# Patient Record
Sex: Female | Born: 1950 | Race: White | Hispanic: No | Smoking: Former smoker
Health system: Southern US, Academic
[De-identification: ages and names within clinical notes are randomized; demographics above are authoritative.]

## PROBLEM LIST (undated history)

## (undated) DIAGNOSIS — Z780 Asymptomatic menopausal state: Secondary | ICD-10-CM

## (undated) DIAGNOSIS — G4730 Sleep apnea, unspecified: Secondary | ICD-10-CM

## (undated) DIAGNOSIS — R790 Abnormal level of blood mineral: Secondary | ICD-10-CM

## (undated) DIAGNOSIS — G2581 Restless legs syndrome: Secondary | ICD-10-CM

## (undated) DIAGNOSIS — M51369 Other intervertebral disc degeneration, lumbar region without mention of lumbar back pain or lower extremity pain: Secondary | ICD-10-CM

## (undated) DIAGNOSIS — R32 Unspecified urinary incontinence: Secondary | ICD-10-CM

## (undated) DIAGNOSIS — R918 Other nonspecific abnormal finding of lung field: Secondary | ICD-10-CM

## (undated) DIAGNOSIS — T7840XA Allergy, unspecified, initial encounter: Secondary | ICD-10-CM

## (undated) DIAGNOSIS — F419 Anxiety disorder, unspecified: Secondary | ICD-10-CM

## (undated) DIAGNOSIS — E538 Deficiency of other specified B group vitamins: Secondary | ICD-10-CM

## (undated) DIAGNOSIS — F32A Depression, unspecified: Secondary | ICD-10-CM

## (undated) DIAGNOSIS — N281 Cyst of kidney, acquired: Secondary | ICD-10-CM

## (undated) DIAGNOSIS — I8390 Asymptomatic varicose veins of unspecified lower extremity: Secondary | ICD-10-CM

## (undated) DIAGNOSIS — Z9889 Other specified postprocedural states: Secondary | ICD-10-CM

## (undated) DIAGNOSIS — K449 Diaphragmatic hernia without obstruction or gangrene: Secondary | ICD-10-CM

## (undated) DIAGNOSIS — K219 Gastro-esophageal reflux disease without esophagitis: Secondary | ICD-10-CM

## (undated) DIAGNOSIS — E559 Vitamin D deficiency, unspecified: Secondary | ICD-10-CM

## (undated) DIAGNOSIS — F0390 Unspecified dementia without behavioral disturbance: Secondary | ICD-10-CM

## (undated) DIAGNOSIS — Z860100 Personal history of colon polyps, unspecified: Secondary | ICD-10-CM

## (undated) DIAGNOSIS — I471 Supraventricular tachycardia, unspecified: Secondary | ICD-10-CM

## (undated) DIAGNOSIS — E213 Hyperparathyroidism, unspecified: Secondary | ICD-10-CM

## (undated) DIAGNOSIS — H409 Unspecified glaucoma: Secondary | ICD-10-CM

## (undated) DIAGNOSIS — R0989 Other specified symptoms and signs involving the circulatory and respiratory systems: Secondary | ICD-10-CM

## (undated) DIAGNOSIS — I10 Essential (primary) hypertension: Secondary | ICD-10-CM

## (undated) DIAGNOSIS — E785 Hyperlipidemia, unspecified: Secondary | ICD-10-CM

## (undated) HISTORY — DX: Diaphragmatic hernia without obstruction or gangrene: K44.9

## (undated) HISTORY — DX: Restless legs syndrome: G25.81

## (undated) HISTORY — PX: ORTHOPEDIC SURGERY: SHX850

## (undated) HISTORY — DX: Asymptomatic varicose veins of unspecified lower extremity: I83.90

## (undated) HISTORY — DX: Other specified symptoms and signs involving the circulatory and respiratory systems: R09.89

## (undated) HISTORY — PX: COLONOSCOPY: SHX174

## (undated) HISTORY — DX: Anxiety disorder, unspecified: F41.9

## (undated) HISTORY — DX: Unspecified urinary incontinence: R32

## (undated) HISTORY — DX: Depression, unspecified: F32.A

## (undated) HISTORY — DX: Supraventricular tachycardia, unspecified: I47.10

## (undated) HISTORY — DX: Deficiency of other specified B group vitamins: E53.8

## (undated) HISTORY — DX: Vitamin D deficiency, unspecified: E55.9

## (undated) HISTORY — PX: HX CARDIAC ABLATION: 2100001323

## (undated) HISTORY — DX: Allergy, unspecified, initial encounter: T78.40XA

## (undated) HISTORY — DX: Hyperparathyroidism, unspecified: E21.3

## (undated) HISTORY — DX: Other nonspecific abnormal finding of lung field: R91.8

## (undated) HISTORY — DX: Unspecified glaucoma: H40.9

## (undated) HISTORY — DX: Supraventricular tachycardia (CMS HCC): I47.1

## (undated) HISTORY — PX: ESOPHAGOGASTRODUODENOSCOPY: SHX1529

## (undated) HISTORY — DX: Gastro-esophageal reflux disease without esophagitis: K21.9

## (undated) HISTORY — DX: Hyperlipidemia, unspecified: E78.5

## (undated) HISTORY — DX: Hypercalcemia: E83.52

## (undated) HISTORY — DX: Unspecified dementia, unspecified severity, without behavioral disturbance, psychotic disturbance, mood disturbance, and anxiety: F03.90

## (undated) HISTORY — PX: PARATHYROID GLAND SURGERY: SHX732

## (undated) HISTORY — DX: Sleep apnea, unspecified: G47.30

## (undated) HISTORY — DX: Other intervertebral disc degeneration, lumbar region without mention of lumbar back pain or lower extremity pain: M51.369

## (undated) HISTORY — DX: Cyst of kidney, acquired: N28.1

## (undated) HISTORY — DX: Asymptomatic menopausal state: Z78.0

## (undated) HISTORY — DX: Abnormal level of blood mineral: R79.0

## (undated) HISTORY — DX: Other specified postprocedural states: Z98.890

## (undated) HISTORY — DX: Essential (primary) hypertension: I10

## (undated) HISTORY — PX: HX COLON SURGERY (ANY): 2100001403

## (undated) HISTORY — DX: Personal history of colon polyps, unspecified: Z86.0100

## (undated) HISTORY — PX: RADIOFREQUENCY ABLATION: SHX2290

---

## 1990-05-19 ENCOUNTER — Inpatient Hospital Stay (HOSPITAL_COMMUNITY): Payer: Self-pay | Admitting: Orthopaedic Surgery

## 1996-04-28 HISTORY — PX: HX TAH AND BSO: SHX83

## 2008-04-28 HISTORY — PX: HX CARPAL TUNNEL RELEASE: SHX101

## 2011-04-29 HISTORY — PX: HX CARDIAC ABLATION: 2100001323

## 2011-07-07 DIAGNOSIS — R002 Palpitations: Secondary | ICD-10-CM | POA: Insufficient documentation

## 2011-07-18 DIAGNOSIS — I499 Cardiac arrhythmia, unspecified: Secondary | ICD-10-CM | POA: Insufficient documentation

## 2011-09-01 DIAGNOSIS — I4719 Other supraventricular tachycardia: Secondary | ICD-10-CM | POA: Insufficient documentation

## 2012-09-07 DIAGNOSIS — M51369 Other intervertebral disc degeneration, lumbar region without mention of lumbar back pain or lower extremity pain: Secondary | ICD-10-CM | POA: Insufficient documentation

## 2012-09-07 DIAGNOSIS — M47812 Spondylosis without myelopathy or radiculopathy, cervical region: Secondary | ICD-10-CM | POA: Insufficient documentation

## 2013-04-28 DIAGNOSIS — Z85038 Personal history of other malignant neoplasm of large intestine: Secondary | ICD-10-CM

## 2013-04-28 HISTORY — DX: Personal history of other malignant neoplasm of large intestine: Z85.038

## 2017-08-20 DIAGNOSIS — G2581 Restless legs syndrome: Secondary | ICD-10-CM | POA: Insufficient documentation

## 2017-09-22 IMAGING — MG 3D SCREENING MAMMO BIL W/CAD
5 series · 7 of 24 positions shown · non-contrast
Comparison: 02/18/2018

------------- REPORT [id number] -------------
ALEKSANDER AGURCIA,DO

EXAM:  3D BILATERAL ANNUAL SCREENING DIGITAL MAMMOGRAM WITH TOMOSYNTHESIS AND CAD
INDICATION: Screening.

[Series 6101: R CC · right · 0.10mm/px · 2 of 4 slices shown]
[im 1/4]
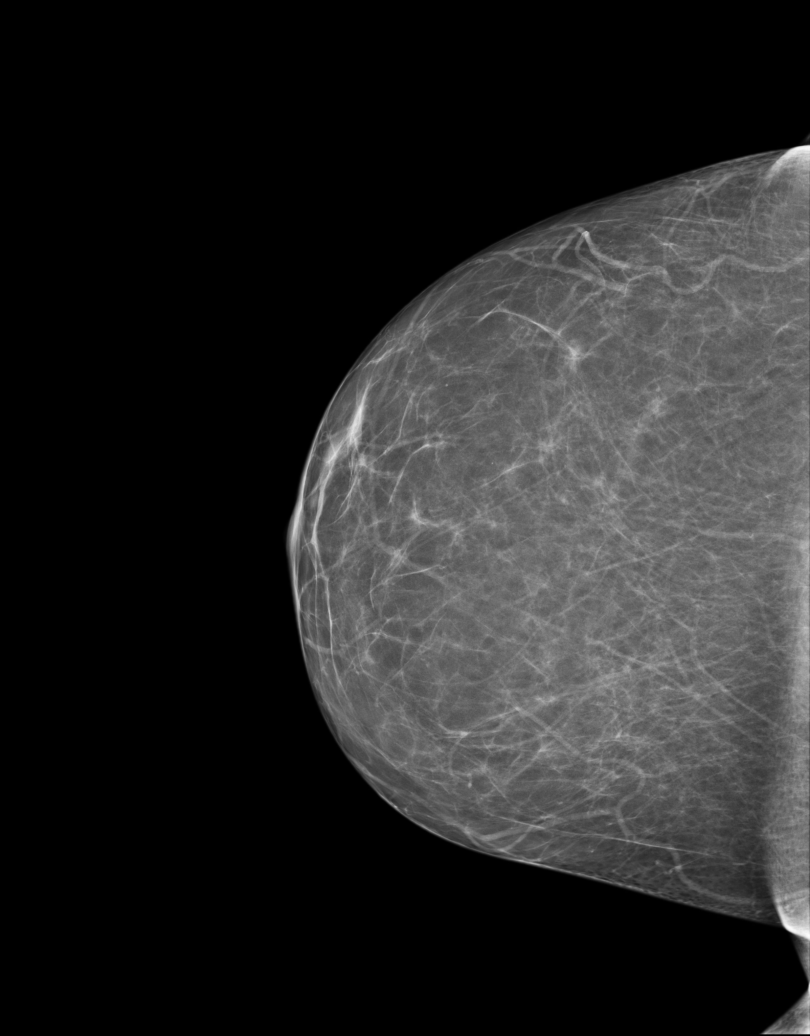
[im 4/4]
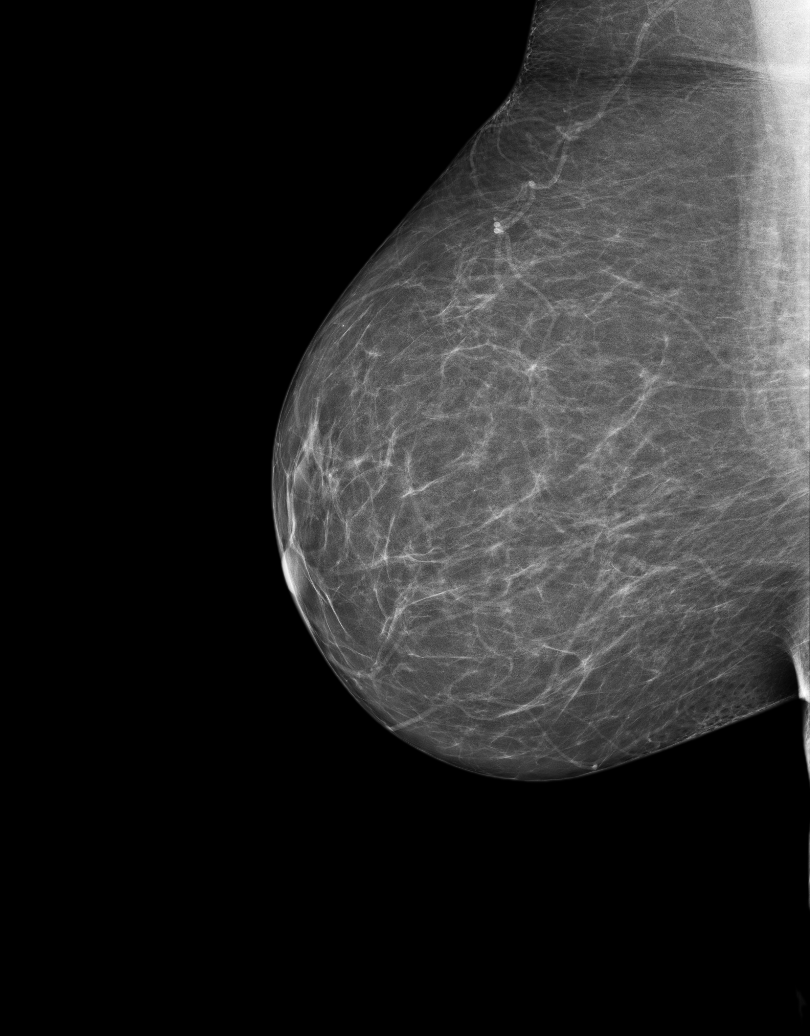

[Series 6103: 3D SCREENING MAMMO BIL W/CAD · 2 acquisitions, 2 frames shown (1 of 2)]
[im 1/2]
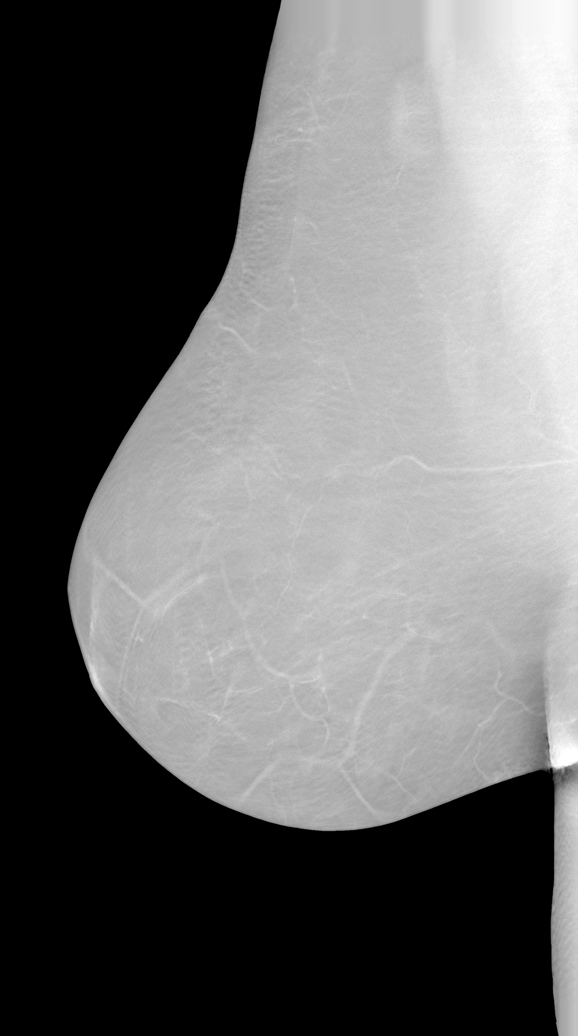
[im 2/2]
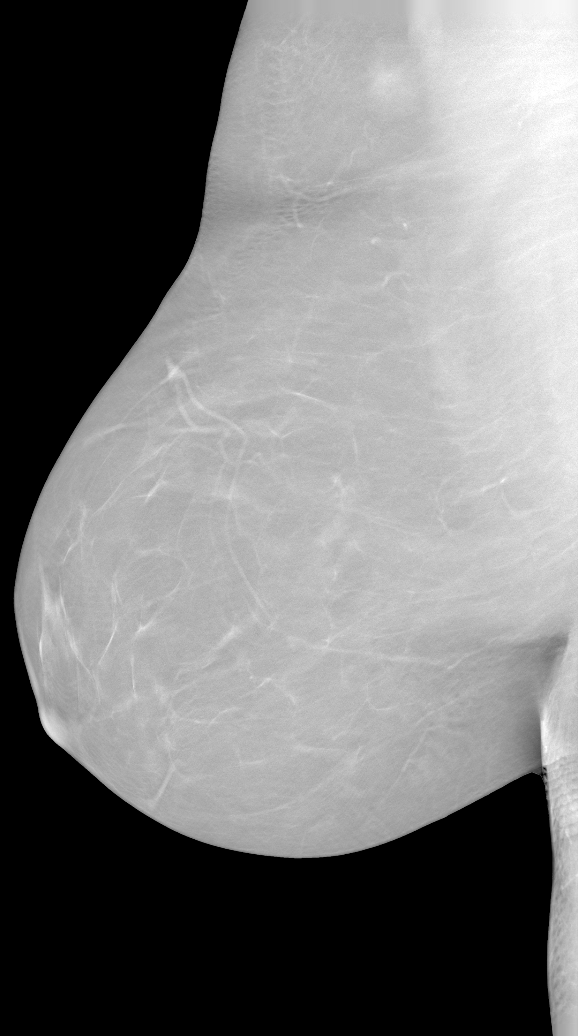

[3D SCREENING MAMMO BIL W/CAD (2 of 2) · tomo slice 14/87.0]
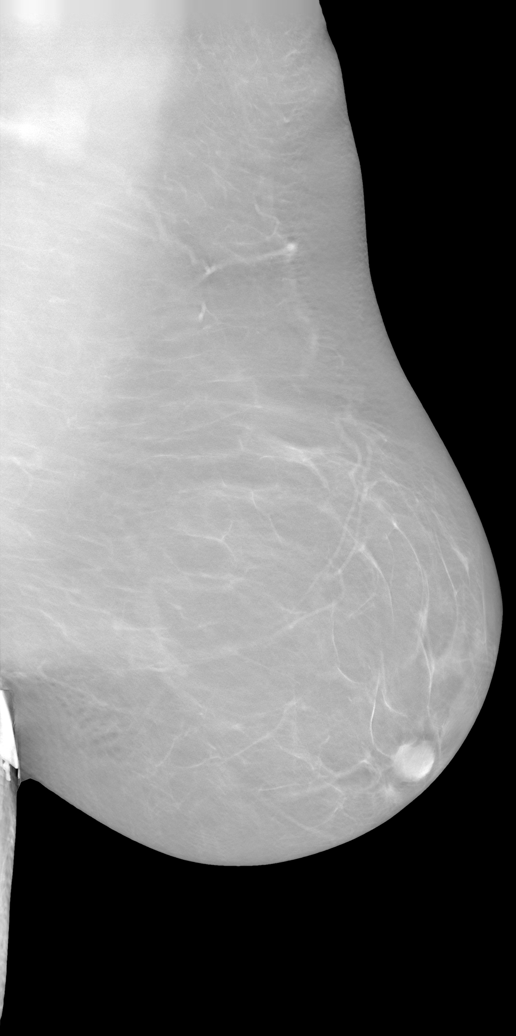

[R]
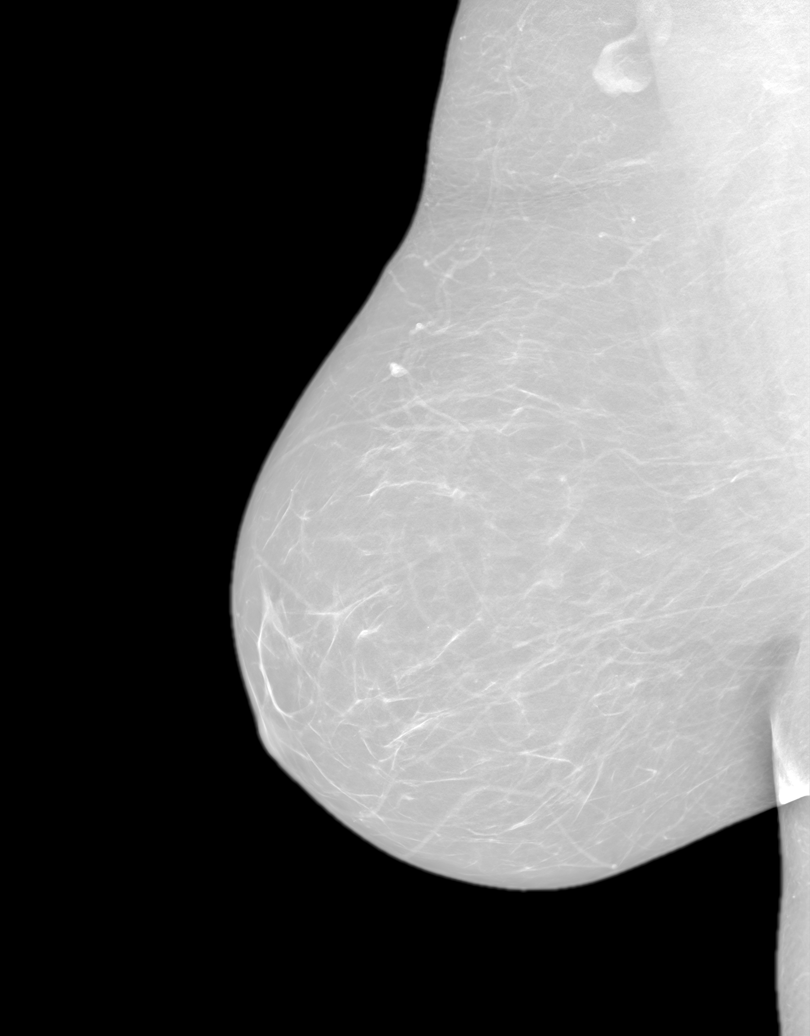

[L]
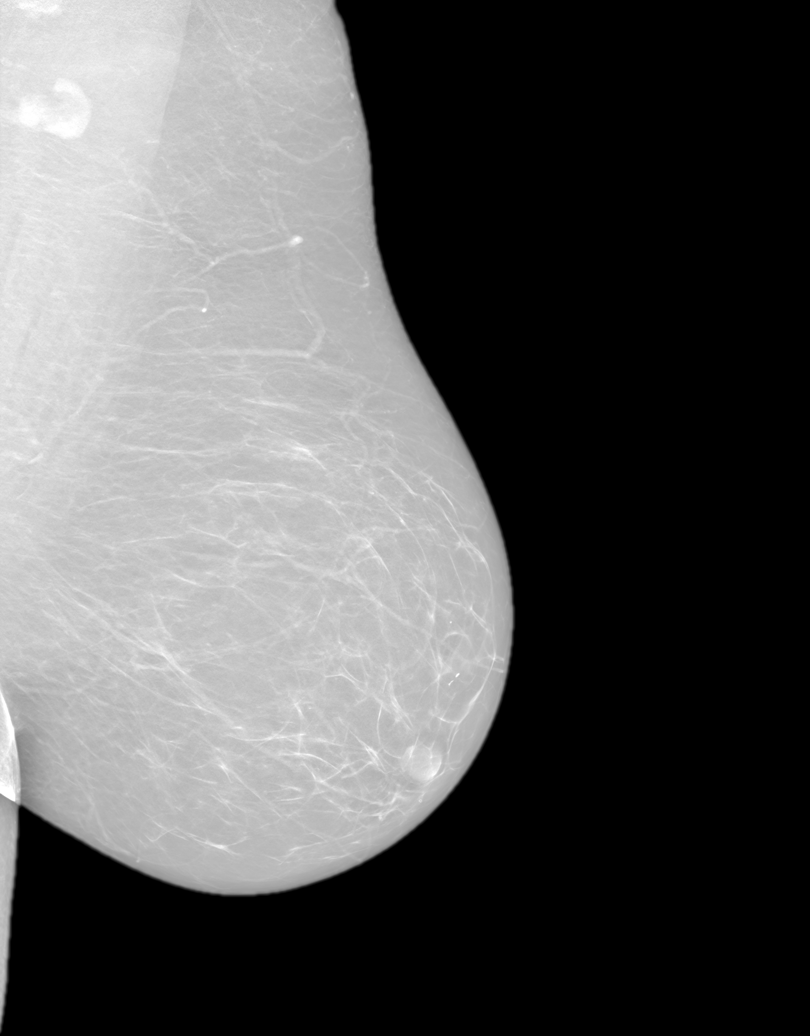

[7 of 24 positions shown; findings below may reference images not displayed]

FINDINGS: A screening mammogram is performed.  The examination shows involutional changes consisting of fatty replacement of the glandular tissue bilaterally.  When compared with an earlier study dated 02/18/2018, no interval development of a focal mass or malignant calcifications is seen.  3D tomosynthesis shows no evidence for an occult mass or calcifications.
IMPRESSION: 1.  Normal stable screening mammogram. 

2.  ACR BREAST DENSITY:  A (Almost entirely fatty). 

3.  BIRADS 1-Negative mammogram. Patient has been added in a reminder system with a target date for the next screening mammography.

4.  Management recommendation:  Routine mammographic screening.   

Final Assessment Code:

Bi-Rads 1 

BI-RADS 0
Need additional imaging evaluation

BI-RADS 1
Negative mammogram

BI-RADS 2
Benign finding

BI-RADS 3
Probably benign finding: short-interval follow-up suggested

BI-RADS 4
Suspicious abnormality:  biopsy should be considered

BI-RADS 5
Highly suggestive of malignancy; appropriate action should be taken

BI-RADS 6
Known Biopsy-proven Malignancy – Appropriate action should be taken

NOTE:
In compliance with Federal regulations, the results of this mammogram are being sent to the patient.

------------- REPORT [id number] -------------
[facility]
[address]
Daina Ms.HANZHUK, GHOTHA:
We wish to report the following on your recent mammography examination. We are sending a report to your referring physician or other health care provider. 
(       Normal/Negative:
No evidence of cancer.
This statement is mandated by the [place], Department of Health.
Your examination was performed by one of our technologists, who are registered radiological technologists and also specially certified in mammography:
___
Parlak, Edaly (M)
___
Dang, Mcalex (M)

Your mammogram was interpreted by our radiologist.

( 
Sofeine Made, M.D.

(Annual Breast Examination by a physician or other health care provider
(Annual Mammography Screening beginning at age 40
(Monthly Breast Self Examination

## 2018-12-23 DIAGNOSIS — G4733 Obstructive sleep apnea (adult) (pediatric): Secondary | ICD-10-CM | POA: Insufficient documentation

## 2019-01-27 IMAGING — MG 3D SCREENING MAMMO BIL W/CAD
5 series · 7 of 24 positions shown · non-contrast
Comparison: 09/30/2019 and 09/27/2018.

------------- REPORT [id number] -------------
[facility]
[address]
Daina Ms.HANZHUK, GHOTHA:
We wish to report the following on your recent mammography examination. We are sending a report to your referring physician or other health care provider. 
(       Normal/Negative:
No evidence of cancer.
This statement is mandated by the [place], Department of Health.
Your examination was performed by one of our technologists, who are registered radiological technologists and also specially certified in mammography:
___
Parlak, Edaly (M)
Nepomuceno, Martinez (M)

Your mammogram was interpreted by our radiologist.
( 
Sofeine Made, M.D.
(Annual Breast Examination by a physician or other health care provider
(Annual Mammography Screening beginning at age 40
(Monthly Breast Self Examination
------------- REPORT [id number] -------------
ANUKELIS PITRENAS,DO
EXAM:  3D BILATERAL ANNUAL SCREENING DIGITAL MAMMOGRAM WITH TOMOSYNTHESIS AND CAD
INDICATION: Screening.

[R CC · right · 0.10mm/px · 2 of 2 slices shown]
[im 1/2]
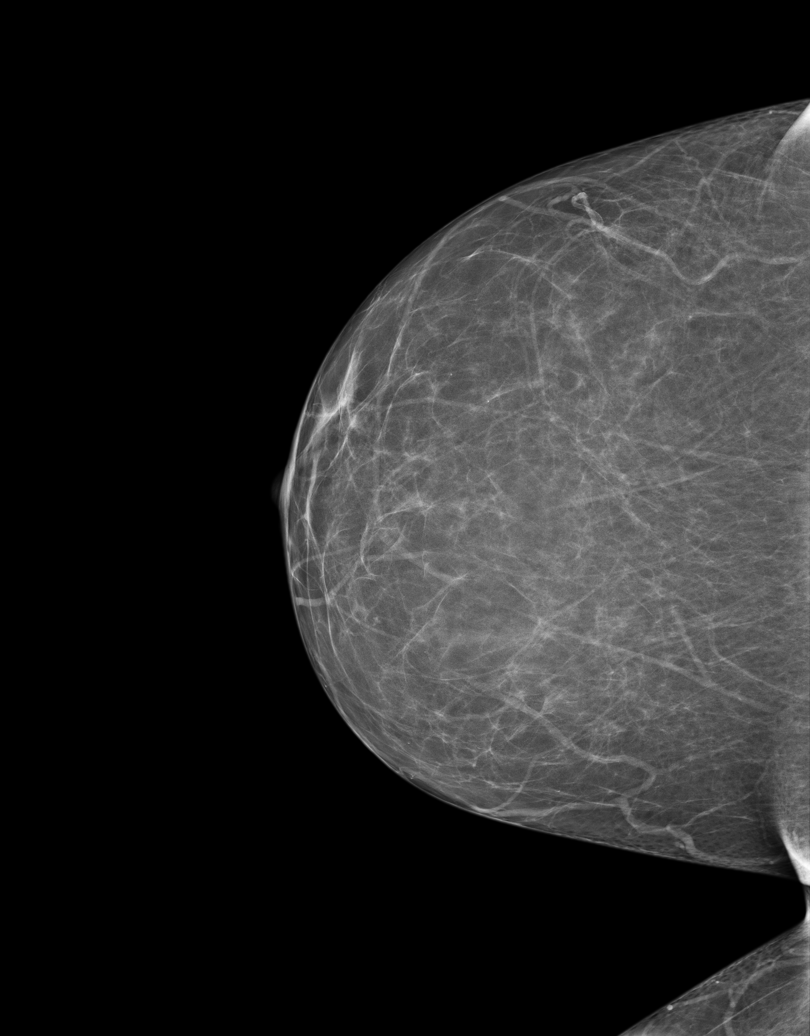
[im 2/2]
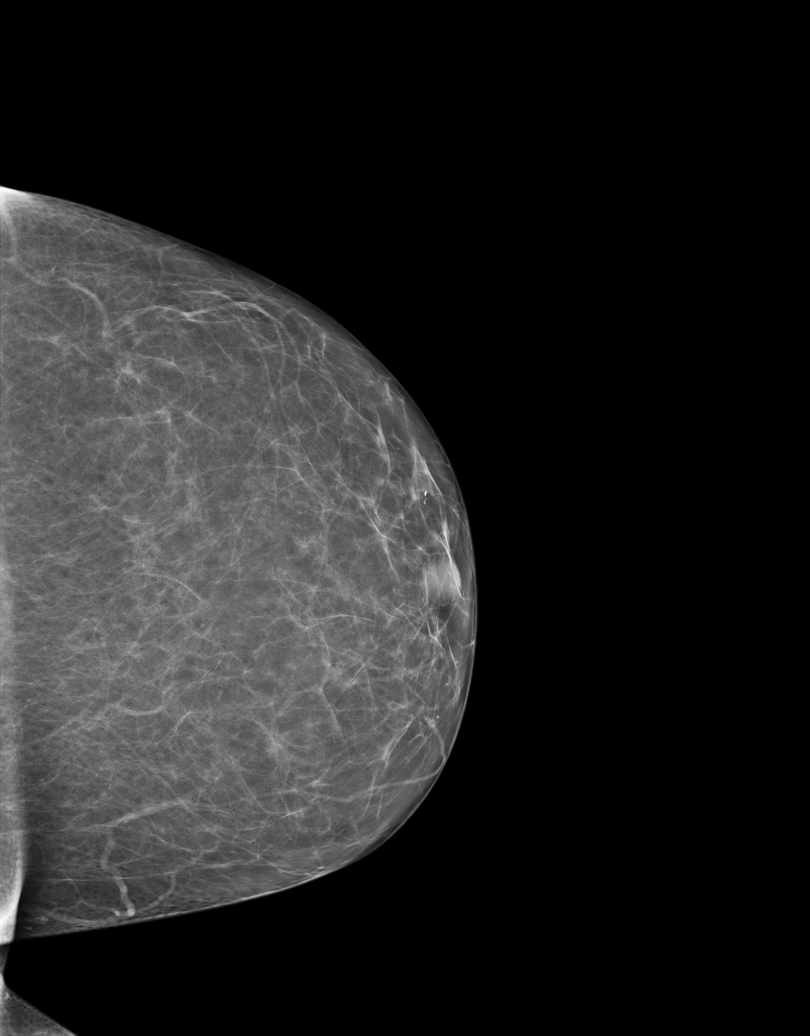

[3D SCREENING MAMMO BIL W/CAD · 2 acquisitions, 2 frames shown (1 of 2)]
[im 1/2]
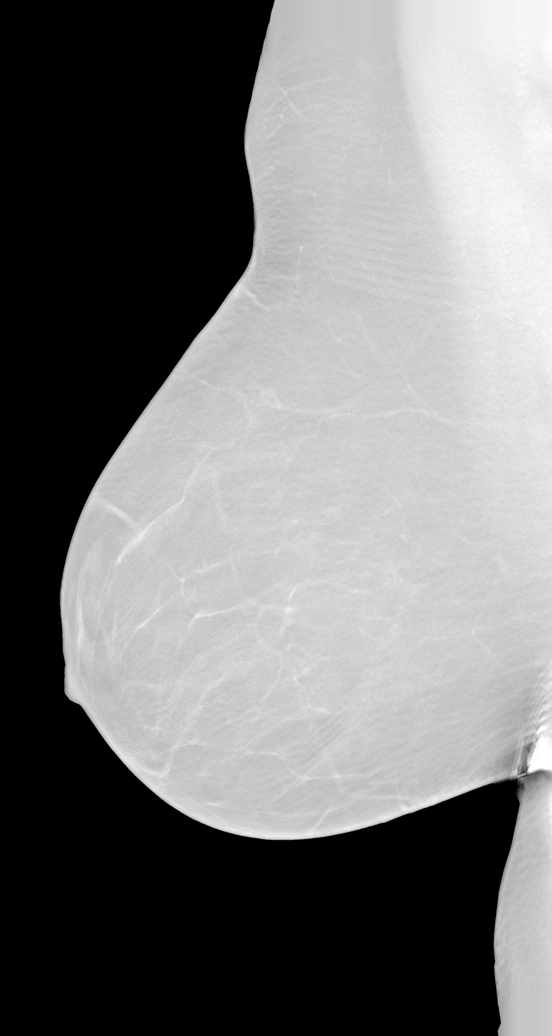
[im 2/2]
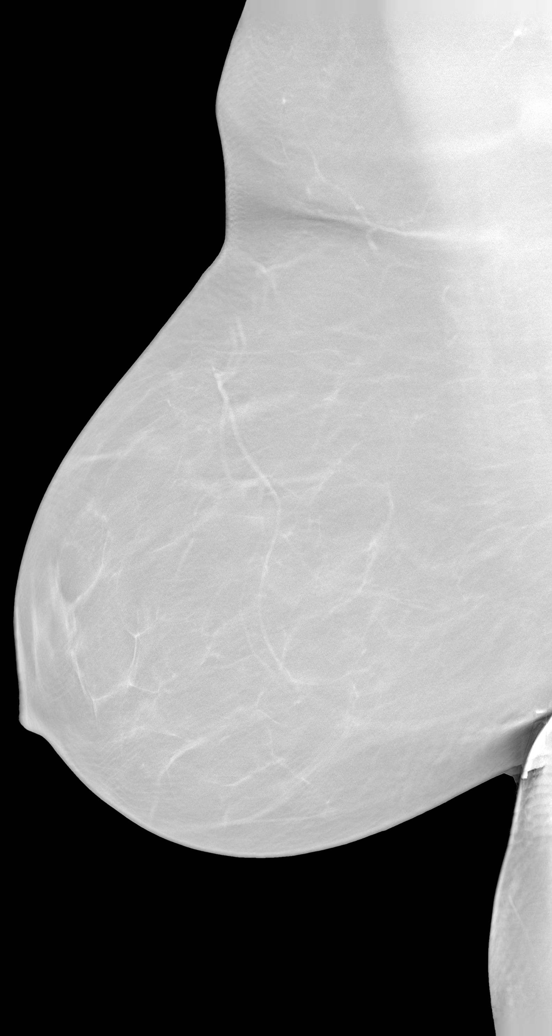

[3D SCREENING MAMMO BIL W/CAD (2 of 2) · tomo slice 13/81.0]
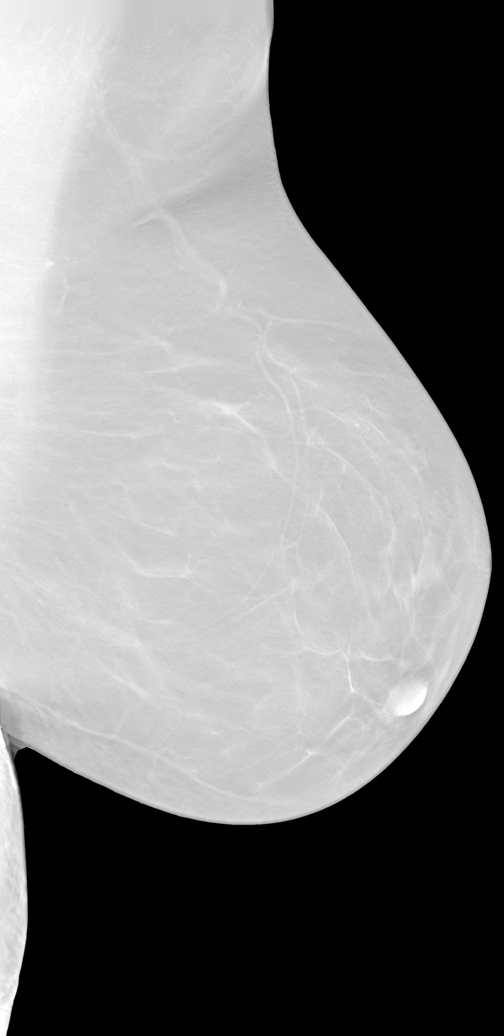

[R]
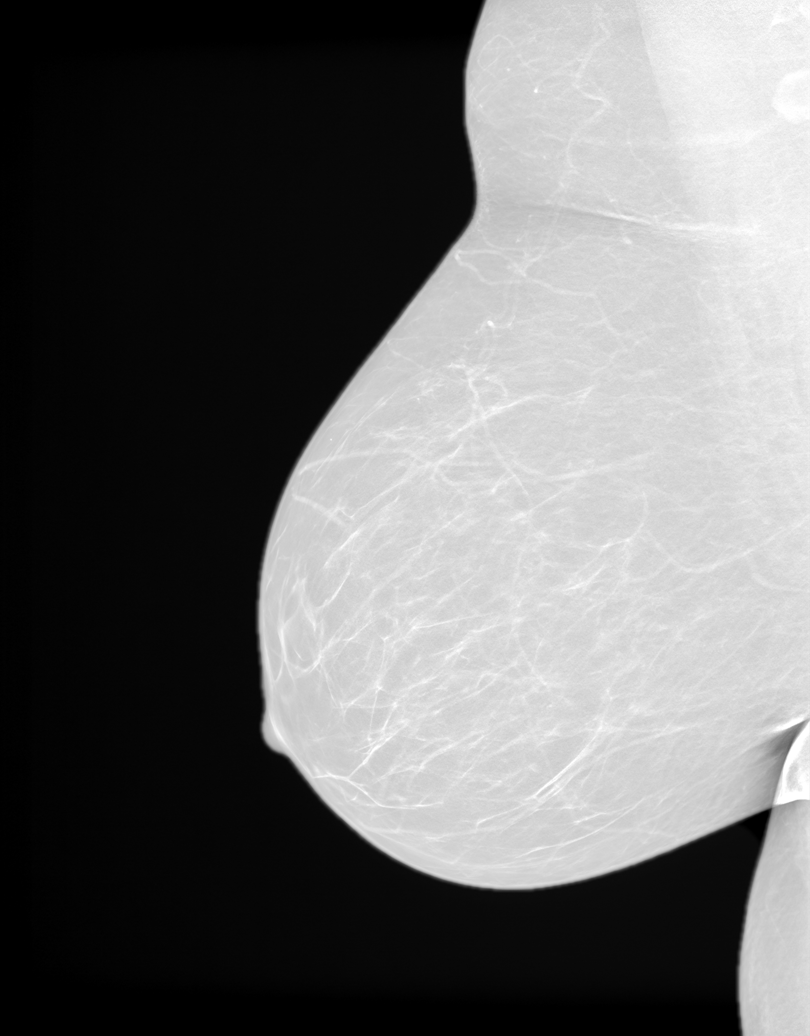

[L]
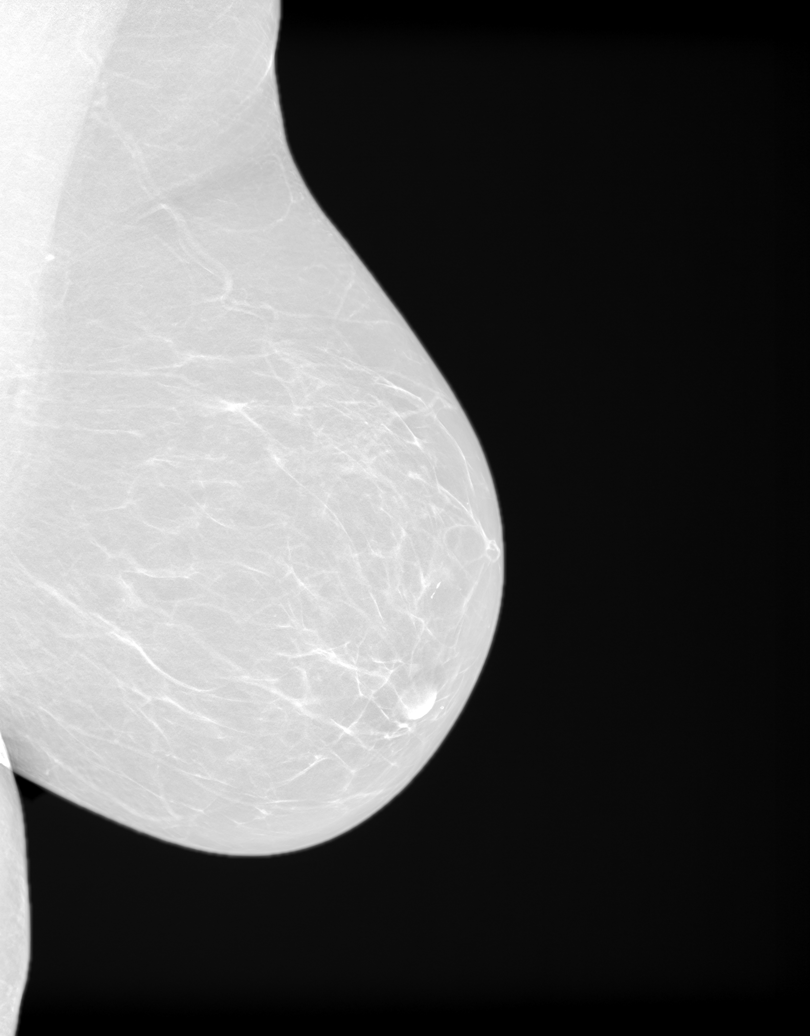

[7 of 24 positions shown; findings below may reference images not displayed]

FINDINGS: There are scattered fibroglandular elements.  There is no mass or suspicious cluster of microcalcifications.   There is no architectural distortion, skin thickening or nipple retraction.
IMPRESSION: 1.  BIRADS 2-Benign findings. Patient has been added in a reminder system with a target date for the next screening mammography.

2.  DENSITY CODE –  B (Scattered areas of fibroglandular density).  

Final Assessment Code:

Bi-Rads 2 

BI-RADS 0
Need additional imaging evaluation

BI-RADS 1
Negative mammogram

BI-RADS 2
Benign finding

BI-RADS 3
Probably benign finding; short-interval follow-up suggested

BI-RADS 4
Suspicious abnormality; biopsy should be considered

BI-RADS 5
Highly suggestive of malignancy; appropriate action should be taken

BI-RADS 6
Known biopsy-proven malignancy; appropriate action should be taken

NOTE:
In compliance with Federal regulations, the results of this mammogram are being sent to the patient.

## 2020-02-07 IMAGING — MG 3D SCREENING MAMMO BIL W/CAD
5 series · 7 of 24 positions shown · non-contrast
Comparison: 10/23/2020 and 06/19/2019.

------------- REPORT [id number] -------------
[facility]
[address]
Daina Ms.HANZHUK, GHOTHA:
We wish to report the following on your recent mammography examination. We are sending a report to your referring physician or other health care provider. 
(       Normal/Negative:
No evidence of cancer.
This statement is mandated by the [place], Department of Health.
Your examination was performed by one of our technologists, who are registered radiological technologists and also specially certified in mammography:
___
Parlak, Edaly (M)
Nepomuceno, Martinez (M)

Your mammogram was interpreted by our radiologist.
( 
Sofeine Made, M.D.
(Annual Breast Examination by a physician or other health care provider
(Annual Mammography Screening beginning at age 40
(Monthly Breast Self Examination
------------- REPORT [id number] -------------
﻿
                         #:
EXAM:  3D BILATERAL ANNUAL SCREENING DIGITAL MAMMOGRAM WITH CAD AND TOMOSYNTHESIS
INDICATION: Screening.

[L]
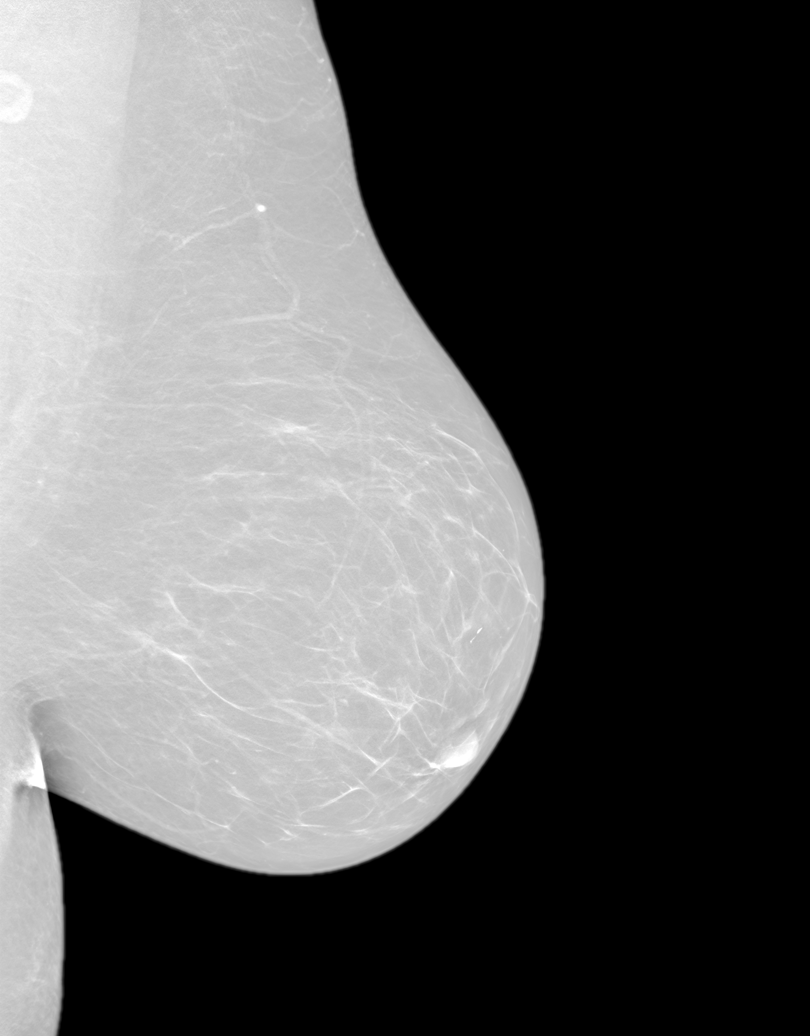

[R]
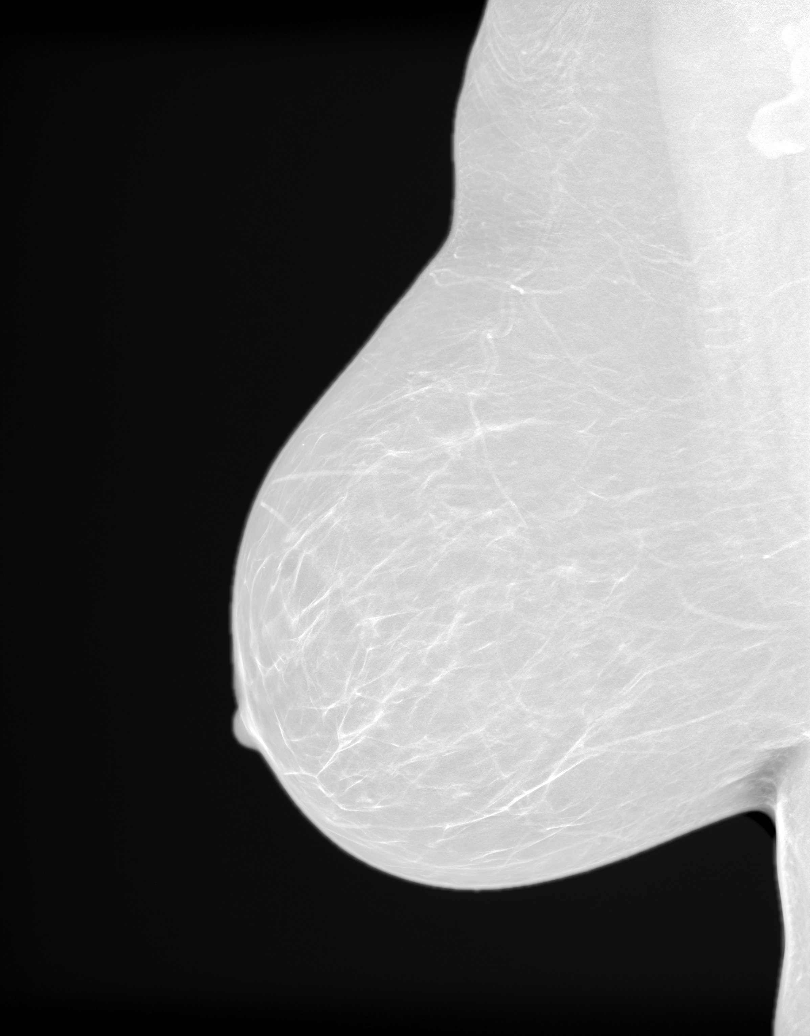

[R CC · right · 2 of 2 slices shown]
[im 1/2]
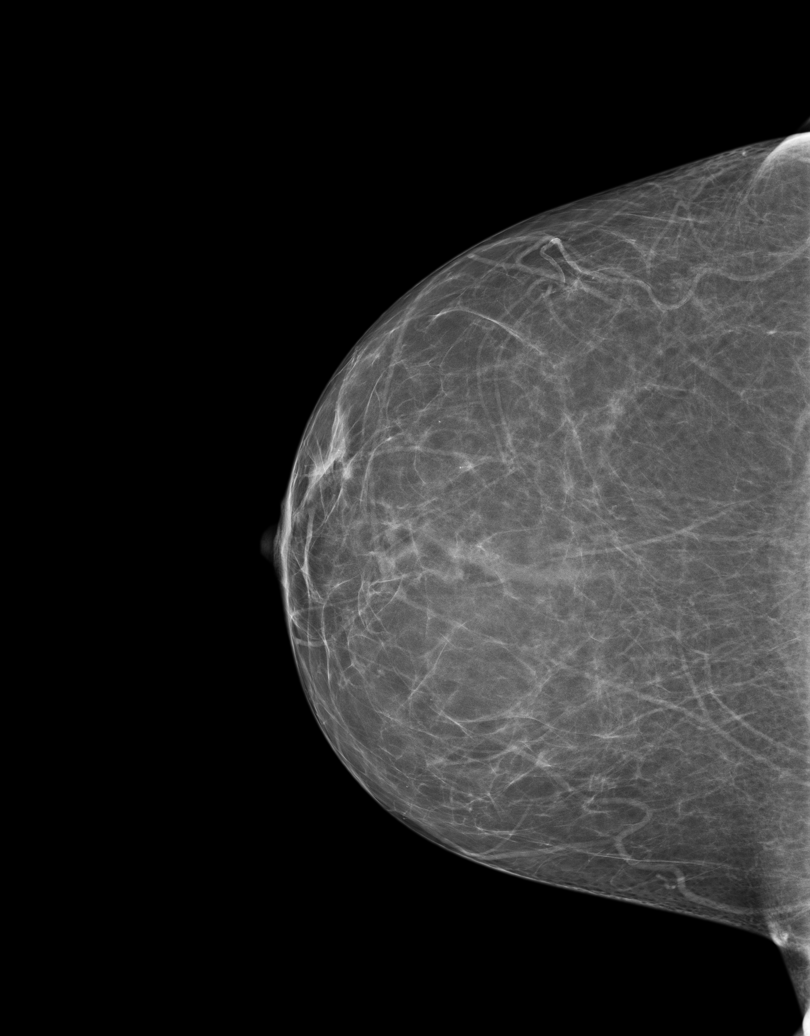
[im 2/2]
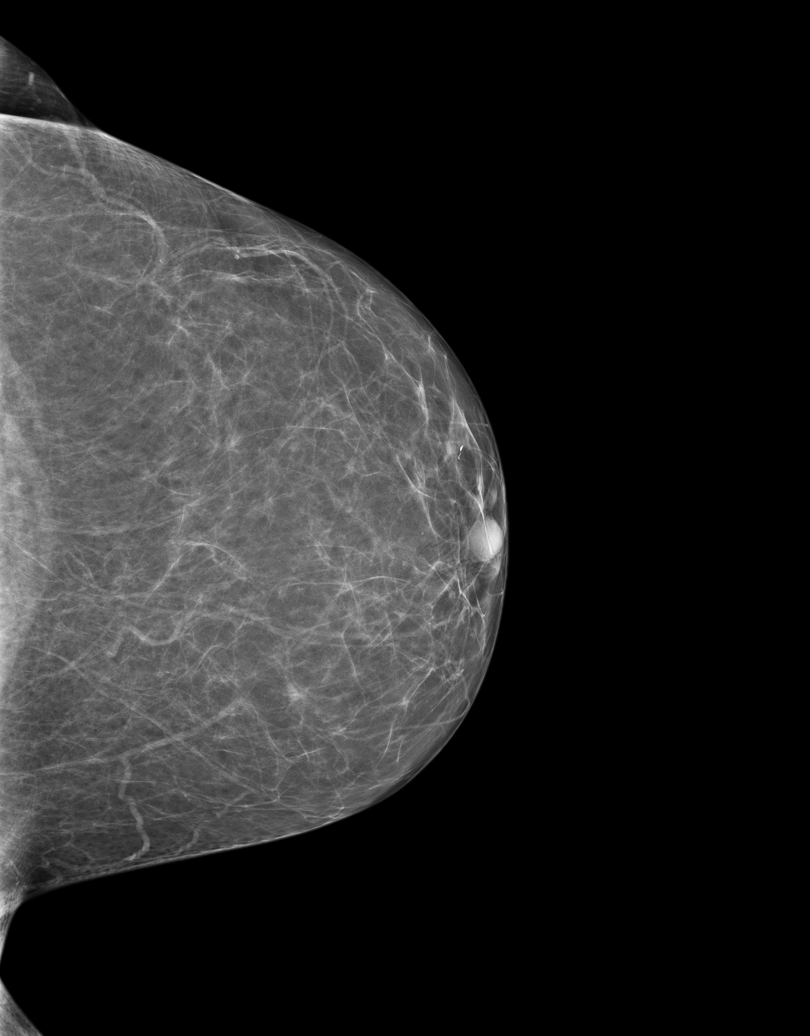

[3D SCREENING MAMMO BIL W/CAD · 2 acquisitions, 2 frames shown (1 of 2)]
[im 1/2]
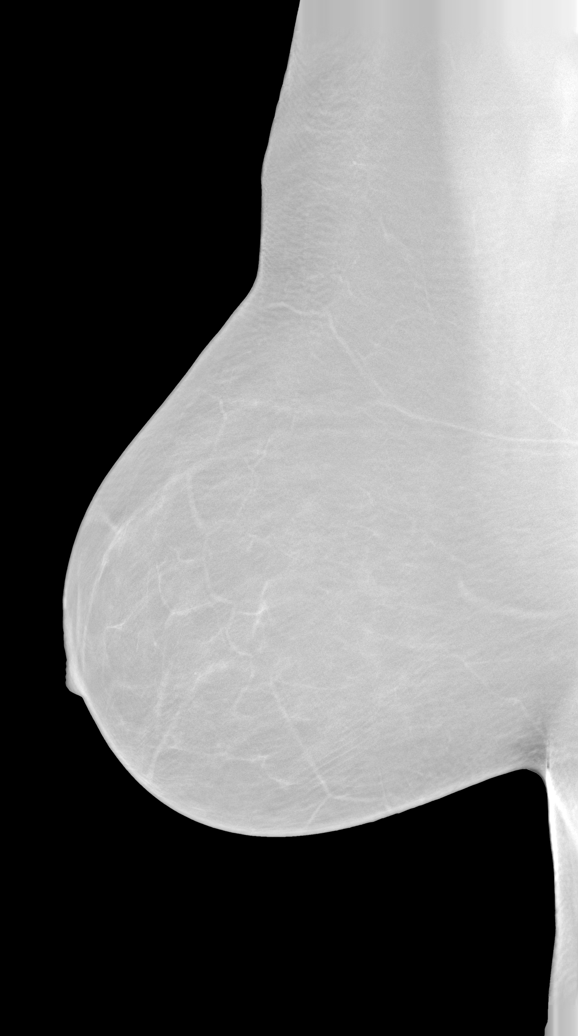
[im 2/2]
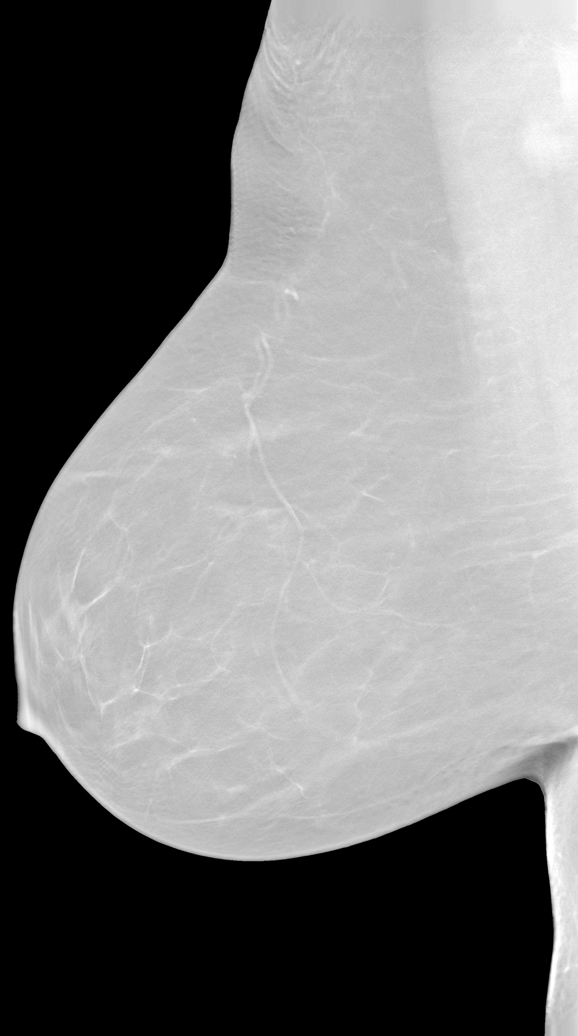

[3D SCREENING MAMMO BIL W/CAD (2 of 2) · tomo slice 13/81.0]
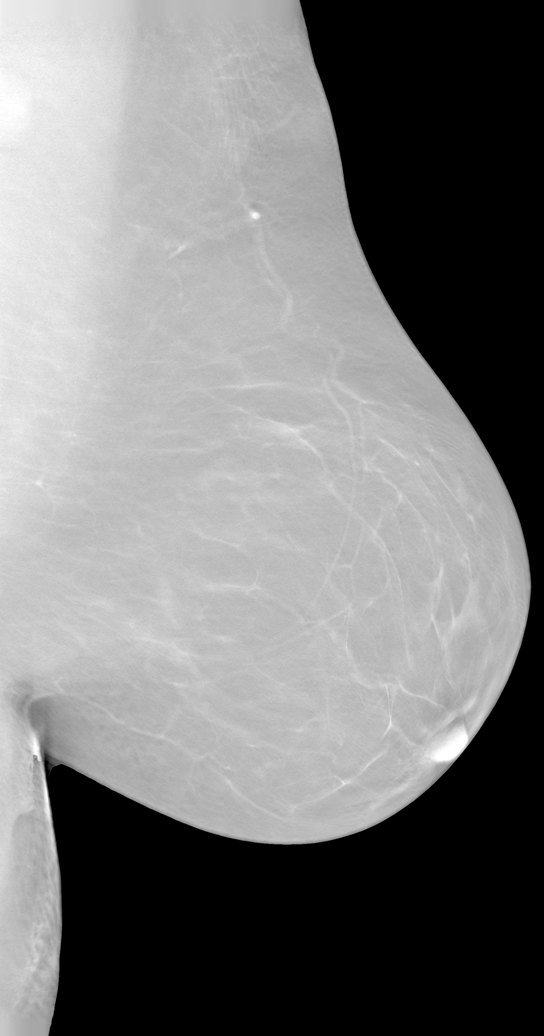

[7 of 24 positions shown; findings below may reference images not displayed]

FINDINGS: There are scattered fibroglandular elements.  There is no mass or suspicious cluster of microcalcifications.   There is no architectural distortion, skin thickening or nipple retraction.
IMPRESSION: 1.  BIRADS 2-Benign findings. Patient has been added in a reminder system with a target date for the next screening mammography.

2.  DENSITY CODE – B (Scattered areas of fibroglandular density). 

Final Assessment Code:

Bi-Rads 2 

BI-RADS 0
Need additional imaging evaluation.

BI-RADS 1
Negative mammogram.

BI-RADS 2
Benign finding.

BI-RADS 3
Probably benign finding; short-interval follow-up suggested.

BI-RADS 4
Suspicious abnormality; biopsy should be considered.

BI-RADS 5
Highly suggestive of malignancy; appropriate action should be taken.

BI-RADS 6
Known biopsy-proven malignancy; appropriate action should be taken.

NOTE:
In compliance with Federal regulations, the results of this mammogram are being sent to the patient.

## 2021-02-07 IMAGING — MG 3D SCREENING MAMMO BIL W/CAD & TOMO
4 series · 5 of 22 positions shown · non-contrast
Comparison: 12/28/2021 and 12/17/2020.

------------- REPORT [id number] -------------
[facility]
[address]
Tiger Ms.VIRGON, RAKI-RATKO:
We wish to report the following on your recent mammography examination. We are sending a report to your referring physician or other health care provider. 
(       Normal/Negative:
No evidence of cancer.
This statement is mandated by the [place], Department of Health.
Your examination was performed by one of our technologists, who are registered radiological technologists and also specially certified in mammography:
___
Markland, Marjuan (M)

Your mammogram was interpreted by our radiologist.
( 
Collette Sedman, M.D.
(Annual Breast Examination by a physician or other health care provider
(Annual Mammography Screening beginning at age 40
(Monthly Breast Self Examination
------------- REPORT [id number] -------------
﻿
WILLY TIGER,DO
EXAM:  3D BILATERAL ANNUAL SCREENING DIGITAL MAMMOGRAM WITH CAD AND TOMOSYNTHESIS
INDICATION: Screening.

[L]
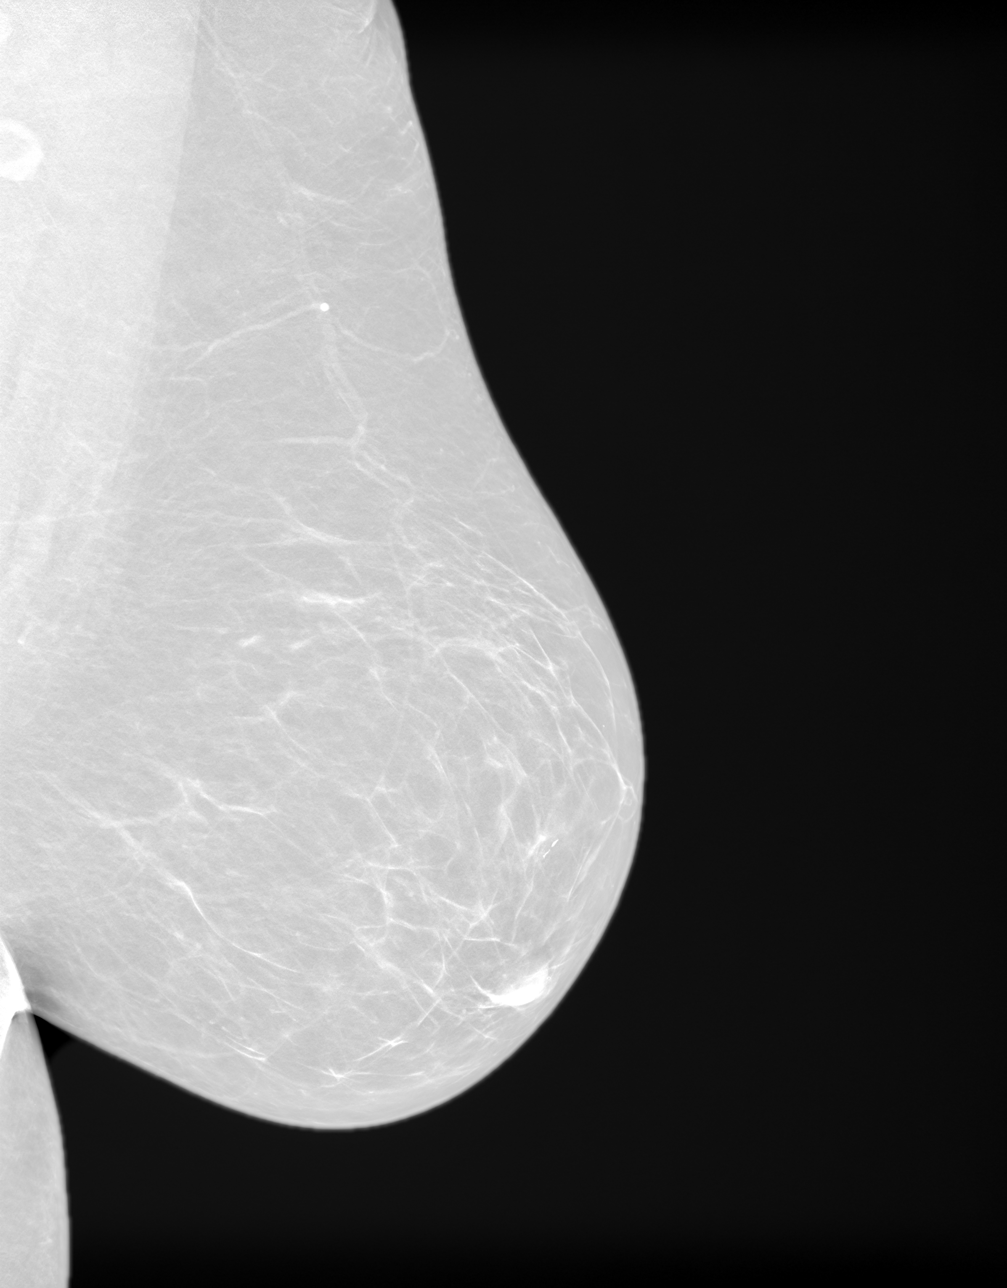

[R]
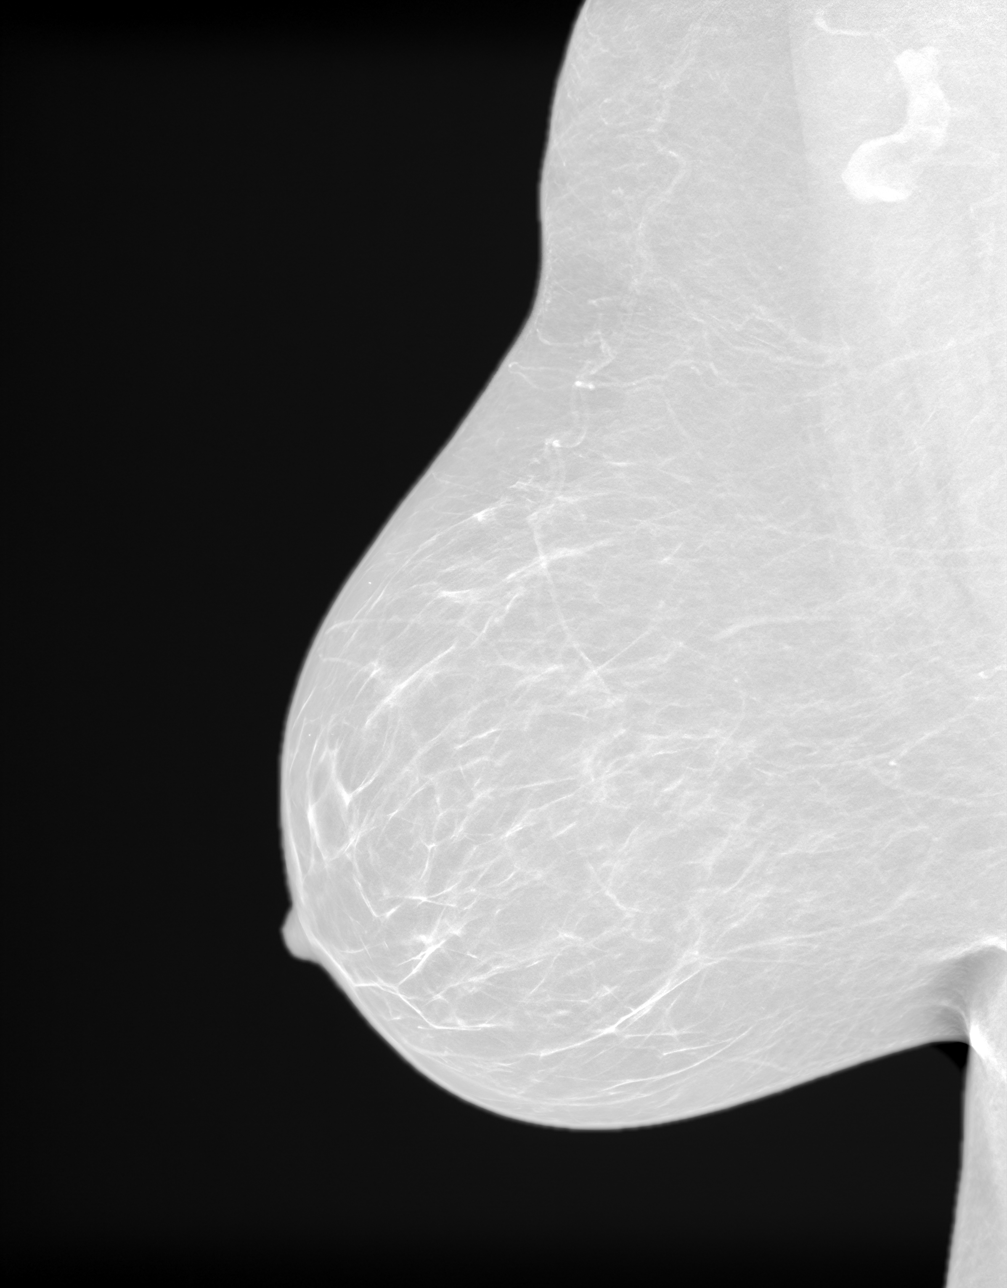

[3D SCREENING MAMMO BIL W/CAD & TOMO tomo · 2 acquisitions, 2 frames shown (1 of 2)]
[im 1/2]
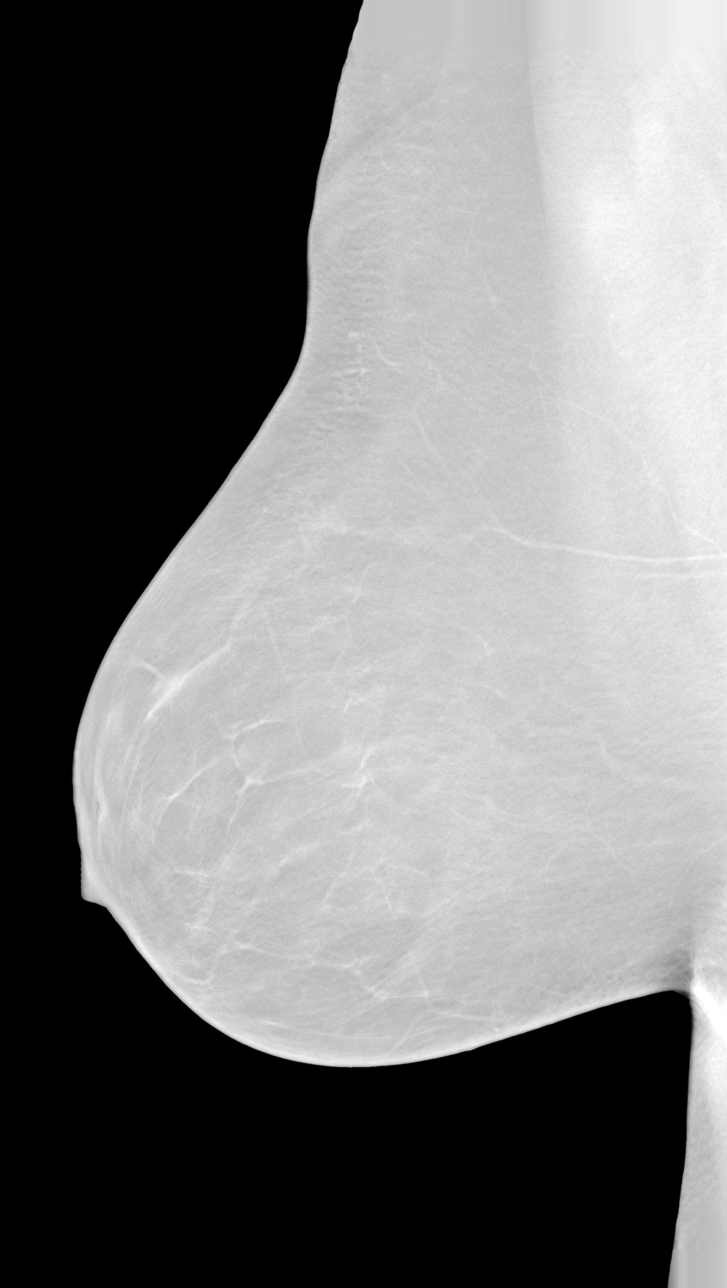
[im 2/2]
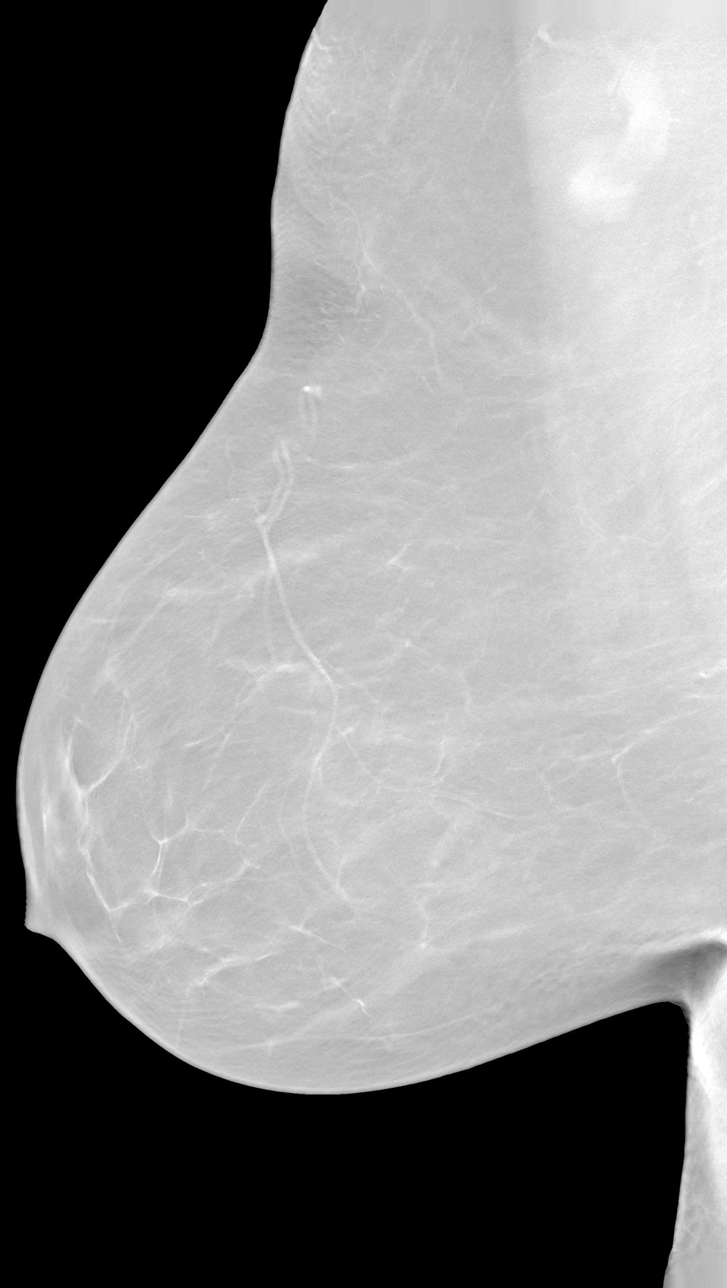

[3D SCREENING MAMMO BIL W/CAD & TOMO tomo (2 of 2) · tomo slice 12/73.0]
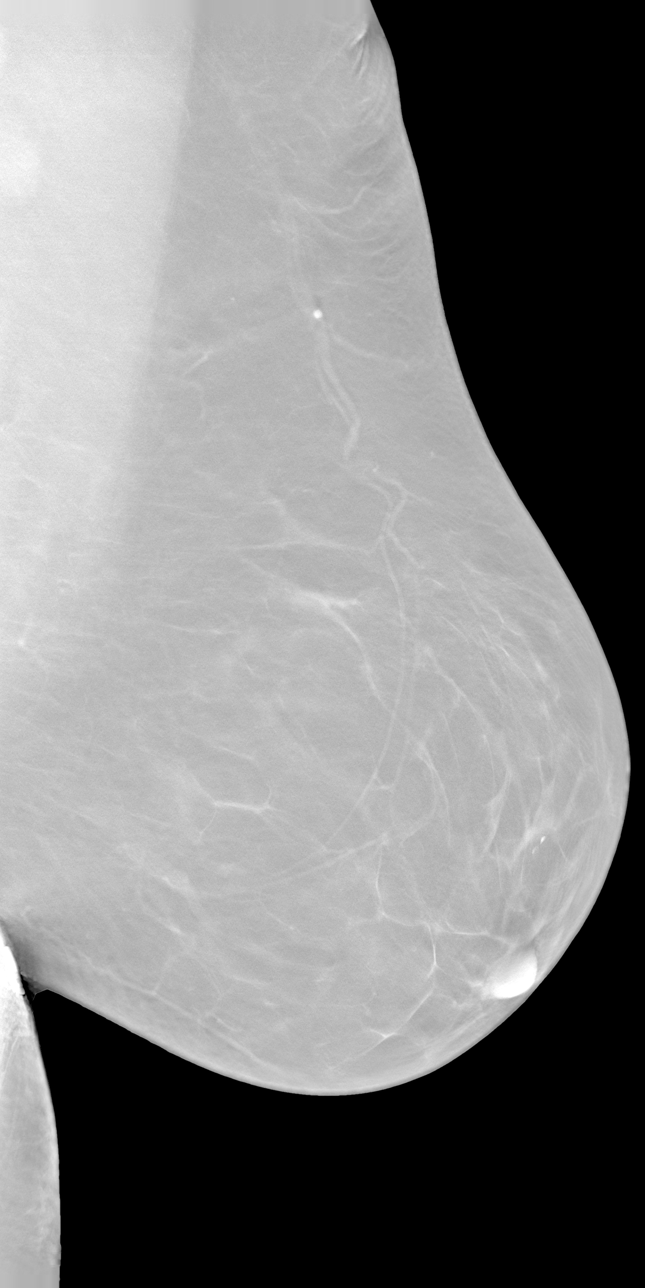

[5 of 22 positions shown; findings below may reference images not displayed]

FINDINGS: There are scattered fibroglandular elements.  There is no mass or suspicious cluster of microcalcifications.   There is no architectural distortion, skin thickening or nipple retraction.
IMPRESSION: 1.  BIRADS 2-Benign findings. Patient has been added in a reminder system with a target date for the next screening mammography.

2.  DENSITY CODE – B (Scattered areas of fibroglandular density). 

Final Assessment Code:

Bi-Rads 2 

BI-RADS 0
 Need additional imaging evaluation.

BI-RADS 1
 Negative mammogram.

BI-RADS 2
 Benign finding.

BI-RADS 3
 Probably benign finding; short-interval follow-up suggested.

BI-RADS 4
 Suspicious abnormality; biopsy should be considered.

BI-RADS 5
 Highly suggestive of malignancy; appropriate action should be taken.

BI-RADS 6
 Known biopsy-proven malignancy; appropriate action should be taken.

NOTE:
In compliance with Federal regulations, the results of this mammogram are being sent to the patient.

## 2021-07-03 ENCOUNTER — Other Ambulatory Visit: Payer: Self-pay

## 2021-07-03 ENCOUNTER — Ambulatory Visit: Payer: Medicare Other | Admitting: Clinical

## 2021-07-03 DIAGNOSIS — F411 Generalized anxiety disorder: Secondary | ICD-10-CM | POA: Insufficient documentation

## 2021-07-03 NOTE — Progress Notes (Signed)
[place] MEDICINE [facility]    [facility] [place] [facility] [place]     Name: BURMA KETCHER MRN:  [id number]   Date: 07/03/2021 DOB: Apr 09, 1951     Start Time: 10:00  End Time: 10:35        SUBJECTIVE:     Lamyiah presents today for an individual therapy session to continue addressing ongoing struggles related to onogoing anxiety. Overall Daylah reports she is doing well and has worked with her daughters to get her bills in order and her trip to [place] planned. She hopes to continue to improve and enjoy the warmer weather.    OBJECTIVE:     Mood: within normal limits   Affect: stable   Thought process: goal oriented    ASSESSMENT:  (F41.1) GAD (generalized anxiety disorder)  (primary encounter diagnosis)          INTERVENTION/PLAN:     Counselor worked with Darl Pikes on continuing to make adjustments her her behaviors to improve her mood. Therapist validated Geraldene's feelings while providing positive feedback. Counselor will continue to work with Darl Pikes on developing appropriate coping skills and processing stressors. Supportive psychotherapy was utilized to encourage identification and expression of present feeling states, validate expressed feelings, and encourage self-efficacy. Therapeutic skills used: developing and using coping skills Harmoney will continue to be encouraged to maintain regular attendance to scheduled appointments, identify triggers for emotional and/or behavioral relapse, and develop effective coping strategies. Leilene will return to clinic in as needed.         Benjie Karvonen, LICSW, 07/03/2021, 11:05

## 2021-07-03 NOTE — Progress Notes (Signed)
[place] MEDICINE [facility]    [facility] [place] [facility] [place]     Name: Bonnie Kline MRN:  [id number]   Date: 07/03/2021 DOB: 04-11-1951     Start Time: 10:00  End Time: 10:35        SUBJECTIVE:     Bonnie Kline presents today for an individual therapy session to continue addressing ongoing struggles related to onogoing anxiety. Overall Bonnie Kline reports she is doing well and has worked with her daughters to get her bills in order and her trip to [place] planned. She hopes to continue to improve and enjoy the warmer weather.    OBJECTIVE:     Mood: within normal limits   Affect: stable   Thought process: goal oriented    ASSESSMENT:  (F41.1) GAD (generalized anxiety disorder)  (primary encounter diagnosis)          INTERVENTION/PLAN:     Counselor worked with Bonnie Kline on continuing to make adjustments her her behaviors to improve her mood. Therapist validated Bonnie Kline's feelings while providing positive feedback. Counselor will continue to work with Bonnie Kline on developing appropriate coping skills and processing stressors. Supportive psychotherapy was utilized to encourage identification and expression of present feeling states, validate expressed feelings, and encourage self-efficacy. Therapeutic skills used: developing and using coping skills Bonnie Kline will continue to be encouraged to maintain regular attendance to scheduled appointments, identify triggers for emotional and/or behavioral relapse, and develop effective coping strategies. Bonnie Kline will return to clinic in as needed.         Benjie Karvonen, LICSW, 07/03/2021, 10:58

## 2021-07-12 ENCOUNTER — Encounter (HOSPITAL_PSYCHIATRIC): Payer: Self-pay | Admitting: Psychiatry

## 2021-07-12 DIAGNOSIS — F411 Generalized anxiety disorder: Secondary | ICD-10-CM

## 2021-07-12 DIAGNOSIS — F332 Major depressive disorder, recurrent severe without psychotic features: Secondary | ICD-10-CM | POA: Insufficient documentation
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
## 2021-07-15 ENCOUNTER — Ambulatory Visit: Payer: Medicare Other | Attending: FAMILY PRACTICE

## 2021-07-15 ENCOUNTER — Other Ambulatory Visit: Payer: Self-pay

## 2021-07-15 DIAGNOSIS — E559 Vitamin D deficiency, unspecified: Secondary | ICD-10-CM | POA: Insufficient documentation

## 2021-07-15 DIAGNOSIS — D649 Anemia, unspecified: Secondary | ICD-10-CM | POA: Insufficient documentation

## 2021-07-15 DIAGNOSIS — E785 Hyperlipidemia, unspecified: Secondary | ICD-10-CM | POA: Insufficient documentation

## 2021-07-15 DIAGNOSIS — R413 Other amnesia: Secondary | ICD-10-CM

## 2021-07-15 DIAGNOSIS — E213 Hyperparathyroidism, unspecified: Secondary | ICD-10-CM | POA: Insufficient documentation

## 2021-07-15 DIAGNOSIS — E538 Deficiency of other specified B group vitamins: Secondary | ICD-10-CM | POA: Insufficient documentation

## 2021-07-15 LAB — CBC WITH DIFF
BASOPHIL #: 0 10*3/uL (ref 0.00–2.50)
BASOPHIL %: 0 % (ref 0–3)
EOSINOPHIL #: 0.2 10*3/uL (ref 0.00–2.40)
EOSINOPHIL %: 5 % (ref 0–7)
HCT: 40.9 % (ref 37.0–47.0)
HGB: 13.6 g/dL (ref 12.5–16.0)
LYMPHOCYTE #: 1.4 10*3/uL — ABNORMAL LOW (ref 2.10–11.00)
LYMPHOCYTE %: 28 % (ref 25–45)
MCH: 29.8 pg (ref 27.0–32.0)
MCHC: 33.2 g/dL (ref 32.0–36.0)
MCV: 89.7 fL (ref 78.0–99.0)
MONOCYTE #: 0.3 10*3/uL (ref 0.00–4.10)
MONOCYTE %: 6 % (ref 0–12)
MPV: 10.6 fL — ABNORMAL HIGH (ref 7.4–10.4)
NEUTROPHIL #: 3 10*3/uL — ABNORMAL LOW (ref 4.10–29.00)
NEUTROPHIL %: 61 % (ref 40–76)
PLATELETS: 227 10*3/uL (ref 140–440)
RBC: 4.57 10*6/uL (ref 4.20–5.40)
RDW: 13.9 % (ref 11.6–14.8)
WBC: 5 10*3/uL (ref 4.0–10.5)
WBCS UNCORRECTED: 5 10*3/uL

## 2021-07-15 LAB — BASIC METABOLIC PANEL
ANION GAP: 3 mmol/L — ABNORMAL LOW (ref 10–20)
BUN/CREA RATIO: 23 — ABNORMAL HIGH (ref 6–22)
BUN: 19 mg/dL (ref 7–25)
CALCIUM: 10.8 mg/dL — ABNORMAL HIGH (ref 8.6–10.3)
CHLORIDE: 110 mmol/L — ABNORMAL HIGH (ref 98–107)
CO2 TOTAL: 29 mmol/L (ref 21–31)
CREATININE: 0.82 mg/dL (ref 0.60–1.30)
ESTIMATED GFR: 76 mL/min/{1.73_m2} (ref >59)
GLUCOSE: 83 mg/dL (ref 74–109)
OSMOLALITY, CALCULATED: 285 mOsm/kg (ref 270–290)
POTASSIUM: 4.1 mmol/L (ref 3.5–5.1)
SODIUM: 142 mmol/L (ref 136–145)

## 2021-07-15 LAB — THYROID STIMULATING HORMONE (SENSITIVE TSH): TSH: 1.91 u[IU]/mL (ref 0.450–5.330)

## 2021-07-15 LAB — PARATHYROID HORMONE (PTH): PTH: 150 pg/mL — ABNORMAL HIGH (ref 12.0–88.0)

## 2021-07-15 LAB — HGA1C (HEMOGLOBIN A1C WITH EST AVG GLUCOSE): HEMOGLOBIN A1C: 5.5 % (ref 4.0–6.0)

## 2021-07-15 LAB — MAGNESIUM: MAGNESIUM: 2.1 mg/dL (ref 1.9–2.7)

## 2021-07-30 ENCOUNTER — Encounter (HOSPITAL_PSYCHIATRIC): Payer: Self-pay | Admitting: Psychiatry

## 2021-07-30 ENCOUNTER — Ambulatory Visit: Payer: Medicare Other | Attending: Psychiatry | Admitting: Psychiatry

## 2021-07-30 ENCOUNTER — Other Ambulatory Visit: Payer: Self-pay

## 2021-07-30 VITALS — BP 120/68 | HR 66 | Resp 16 | Ht 66.0 in | Wt 173.0 lb

## 2021-07-30 DIAGNOSIS — F332 Major depressive disorder, recurrent severe without psychotic features: Secondary | ICD-10-CM | POA: Insufficient documentation

## 2021-07-30 DIAGNOSIS — F411 Generalized anxiety disorder: Secondary | ICD-10-CM | POA: Insufficient documentation

## 2021-07-30 DIAGNOSIS — Z634 Disappearance and death of family member: Secondary | ICD-10-CM | POA: Insufficient documentation

## 2021-07-30 MED ORDER — FLUOXETINE 20 MG CAPSULE
20.0000 mg | ORAL_CAPSULE | Freq: Every day | ORAL | 3 refills | Status: DC
Start: 2021-07-30 — End: 2021-10-22

## 2021-07-30 MED ORDER — FLUOXETINE 40 MG CAPSULE
40.0000 mg | ORAL_CAPSULE | Freq: Every day | ORAL | 3 refills | Status: DC
Start: 2021-07-30 — End: 2021-10-22

## 2021-07-30 MED ORDER — BUSPIRONE 15 MG TABLET
15.0000 mg | ORAL_TABLET | Freq: Three times a day (TID) | ORAL | 3 refills | Status: DC
Start: 2021-07-30 — End: 2021-10-22
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
## 2021-07-30 NOTE — Patient Instructions (Addendum)
Take medications as prescribed, avoid drugs and alcohol, call office if symptoms worsen or problems arise.  [phone].

## 2021-08-12 IMAGING — MR MRI BRAIN W/O CONTRAST
8 of 10 series · 33 of 48 positions shown · non-contrast
Comparison: June 12, 2020

﻿EXAM:  MRI BRAIN W/O CONTRAST
INDICATION: Amnesia.  Confusion.
TECHNIQUE: Noncontrast multiplanar, multisequence MRI was performed.

[Series 5: DWI · axial · 5.0mm · 1.35mm/px · z∈[-70,+56]mm · 8 of 88 slices shown (1 of 3)]
[im 1/88]
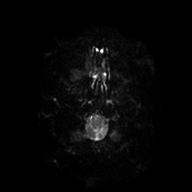
[im 14/88]
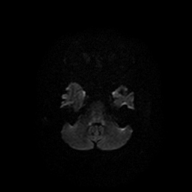
[im 27/88]
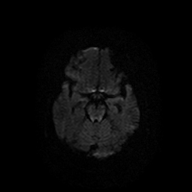
[im 41/88]
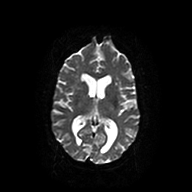
[im 47/88]
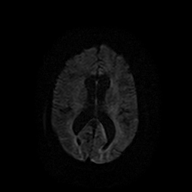
[im 61/88]
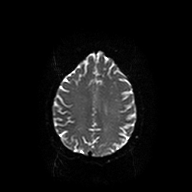
[im 74/88]
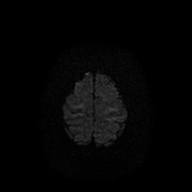
[im 88/88]
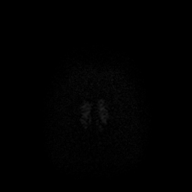

[Series 6: DWI · axial · 5.0mm · 1.35mm/px · z∈[-70,+56]mm · 3 of 22 slices shown (2 of 3)]
[im 1/22]
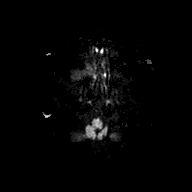
[im 11/22]
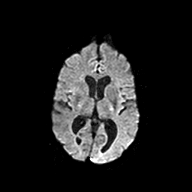
[im 22/22]
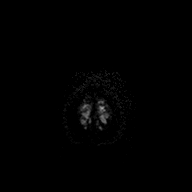

[Series 7: DWI · axial · 5.0mm · 1.35mm/px · z∈[-70,+56]mm · 3 of 21 slices shown (3 of 3)]
[im 1/21]
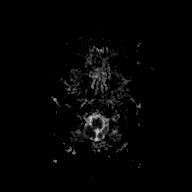
[im 11/21]
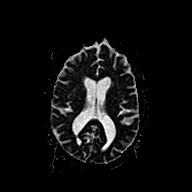
[im 21/21]
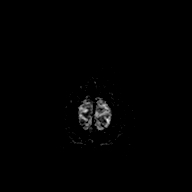

[Series 8: FLAIR · sagittal · 4.0mm · 0.75mm/px · 4 of 26 slices shown (1 of 2)]
[im 1/26]
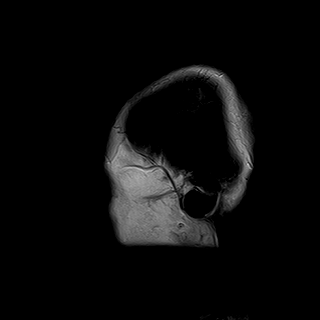
[im 9/26]
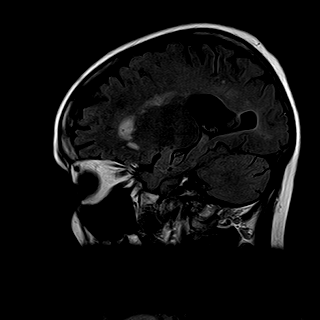
[im 17/26]
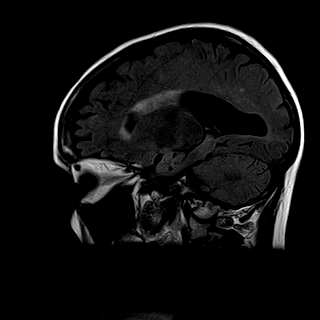
[im 26/26]
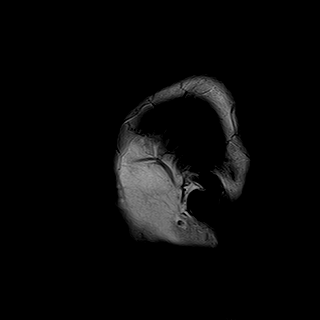

[Series 9: T2 · axial · 5.0mm · 0.43mm/px · z∈[-86,+58]mm · 4 of 25 slices shown (1 of 3)]
[im 1/25]
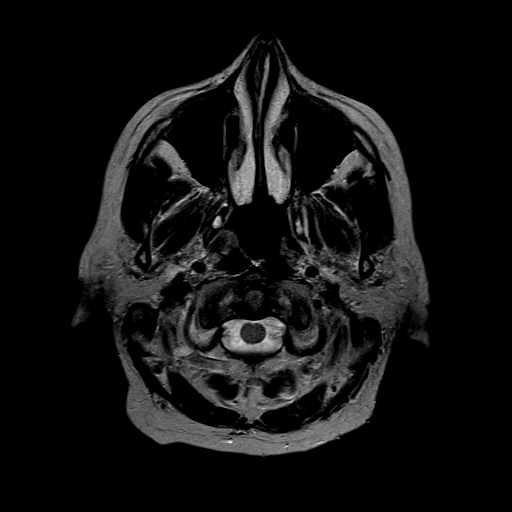
[im 9/25]
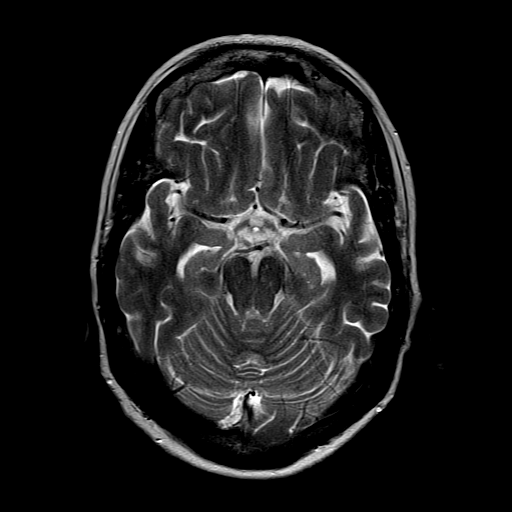
[im 17/25]
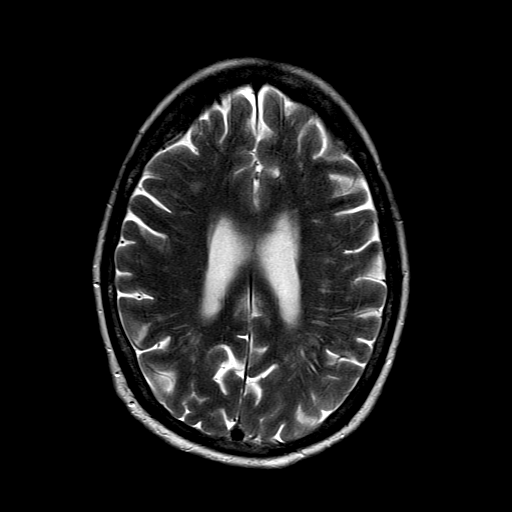
[im 25/25]
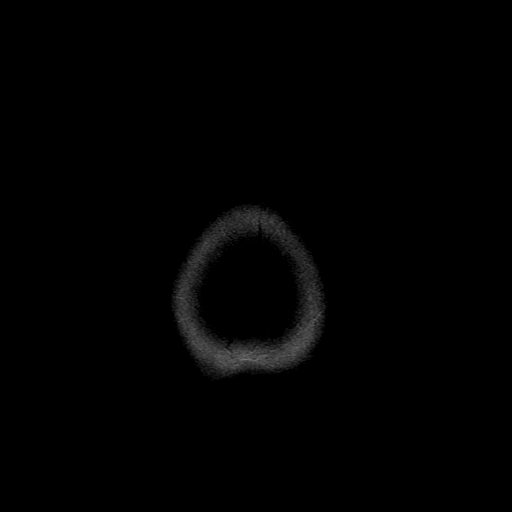

[Series 10: FLAIR · axial · 5.0mm · 0.43mm/px · z∈[-86,+58]mm · 4 of 25 slices shown (2 of 2)]
[im 1/25]
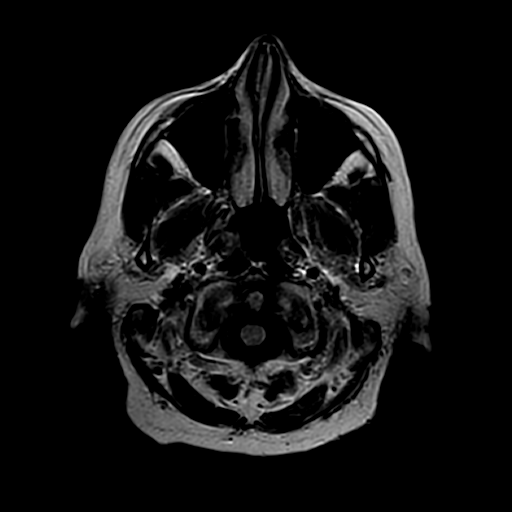
[im 9/25]
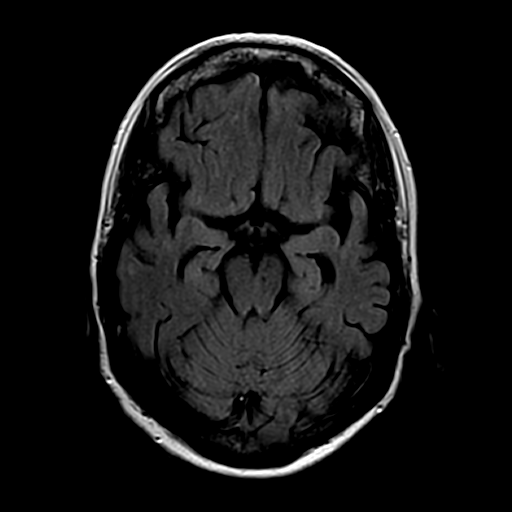
[im 17/25]
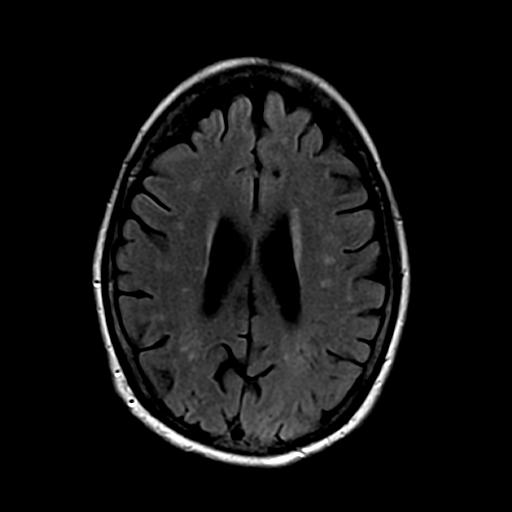
[im 25/25]
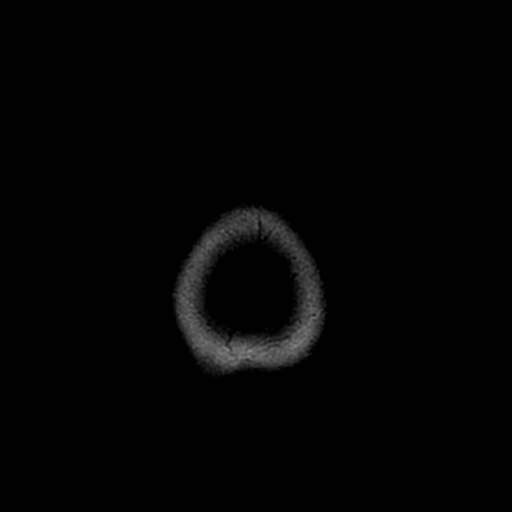

[Series 13: T2 · coronal · 6.2mm · 0.43mm/px · 4 of 24 slices shown (2 of 3)]
[im 1/24]
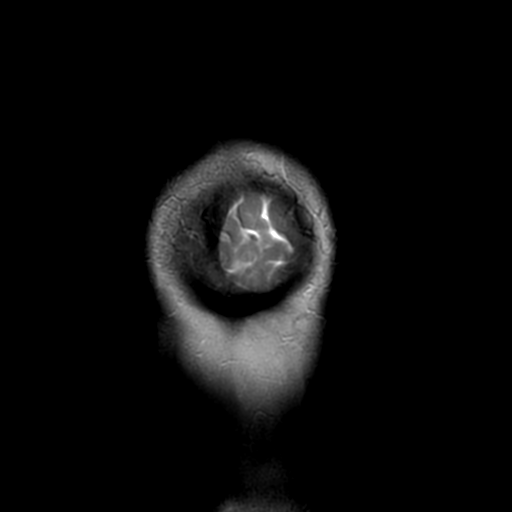
[im 8/24]
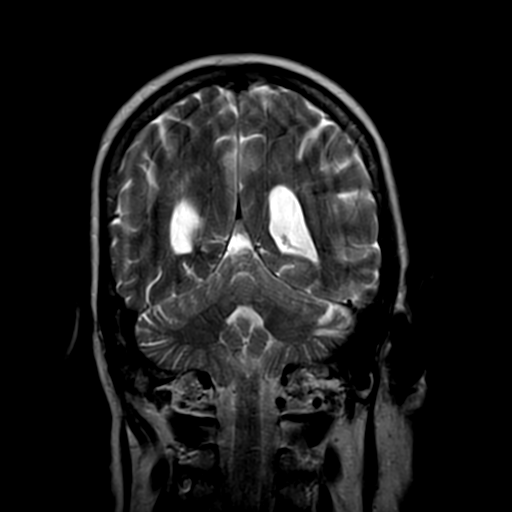
[im 16/24]
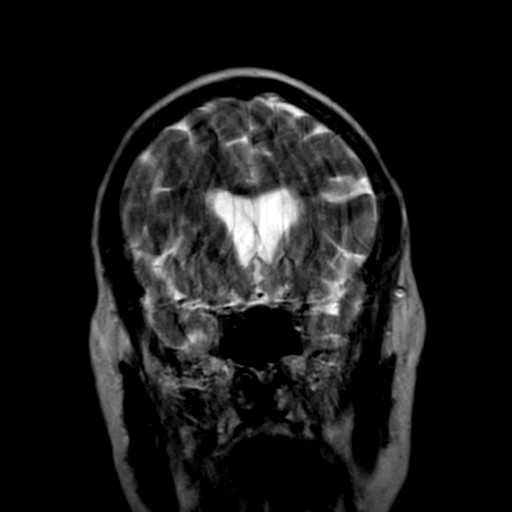
[im 24/24]
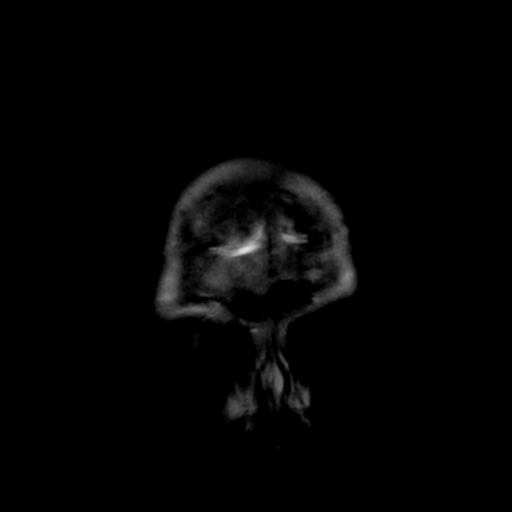

[Series 14: T2 · coronal · 6.2mm · 0.43mm/px · 3 of 24 slices shown (3 of 3)]
[im 1/24]
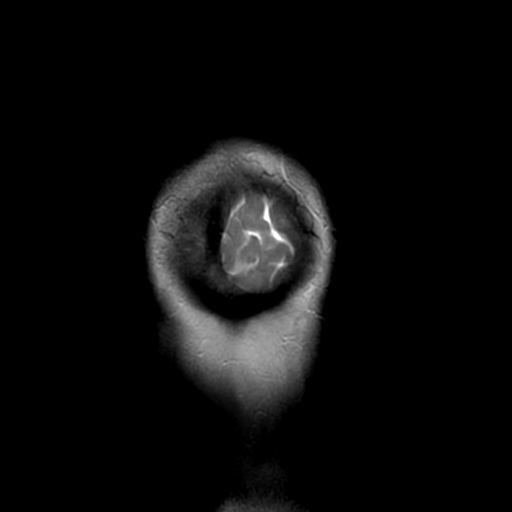
[im 8/24]
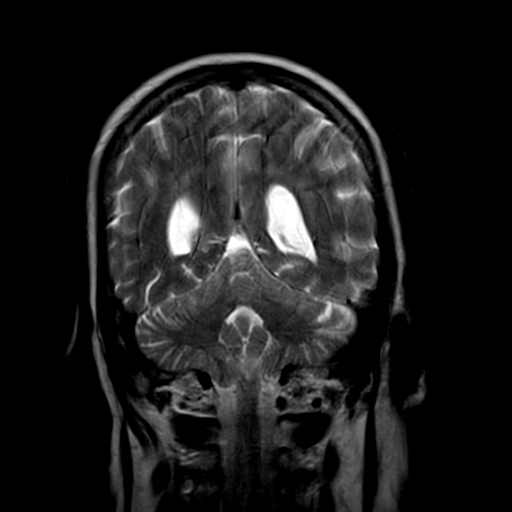
[im 16/24]
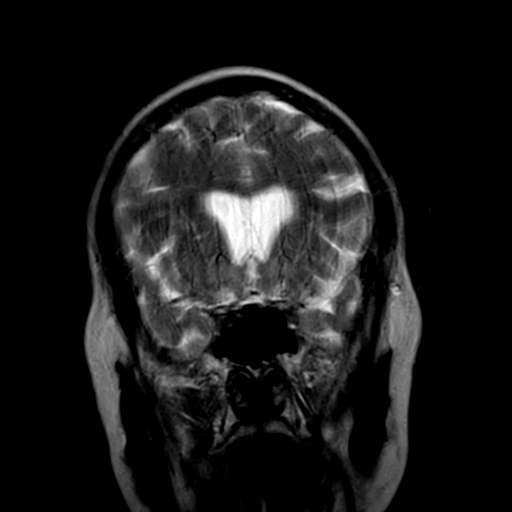

[33 of 48 positions shown; findings below may reference images not displayed]

FINDINGS: Compared to a prior examination dated June 12, 2020, there has been no significant change.  Again noted is mild atrophy and mild chronic small vessel ischemic change.  

There is no evidence of mass, hemorrhage, shift of the midline structures, or extra-axial collection.  There is prominence of the ventricles and CSF spaces.  

The paranasal sinuses and mastoid air cells appear clear.
IMPRESSION: 1. Mild atrophy and mild chronic small vessel ischemic change.

2. No significant change compared to June 12, 2020.

## 2021-08-12 IMAGING — MR MRI CERVICAL SPINE WITHOUT CONTRAST
6 series · 29 of 48 positions shown · non-contrast
Comparison: None.

﻿EXAM:  72313   MRI CERVICAL SPINE WITHOUT CONTRAST
INDICATION: Neck pain.
TECHNIQUE: Noncontrast multiplanar, multisequence MRI was performed.

[Series 4: s-map · sagittal · 8.8mm · 4.38mm/px · 8 of 100 slices shown]
[im 4/100]
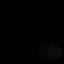
[im 16/100]
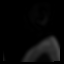
[im 32/100]
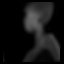
[im 44/100]
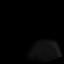
[im 56/100]
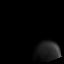
[im 68/100]
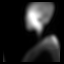
[im 84/100]
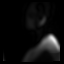
[im 96/100]
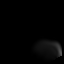

[Series 5: T2 · sagittal · 3.0mm · 0.75mm/px · 4 of 15 slices shown (1 of 2)]
[im 1/15]
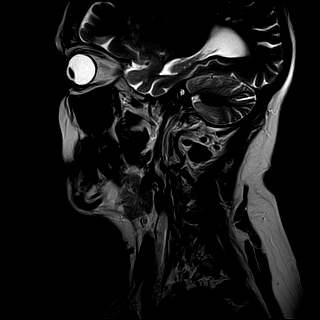
[im 5/15]
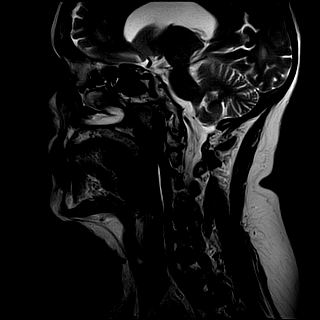
[im 10/15]
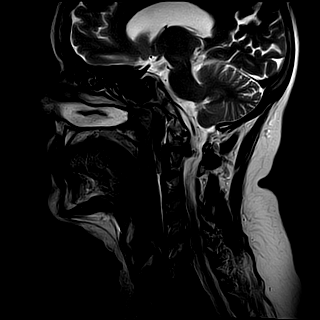
[im 15/15]
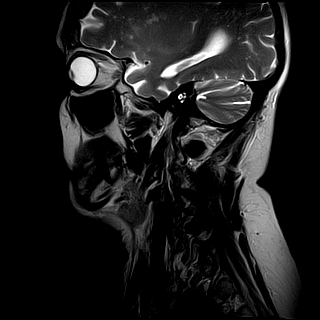

[Series 6: T1 · sagittal · 3.0mm · 0.47mm/px · 4 of 15 slices shown]
[im 1/15]
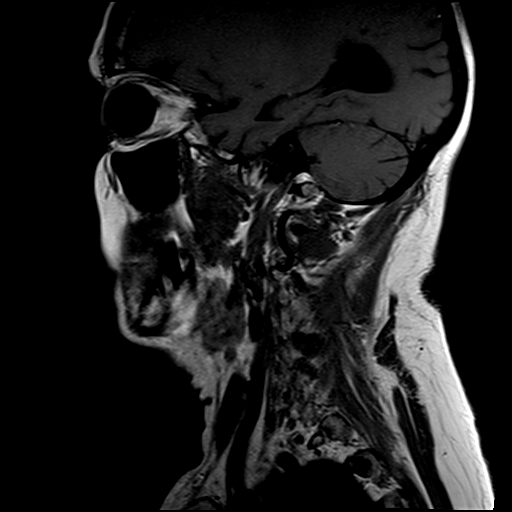
[im 5/15]
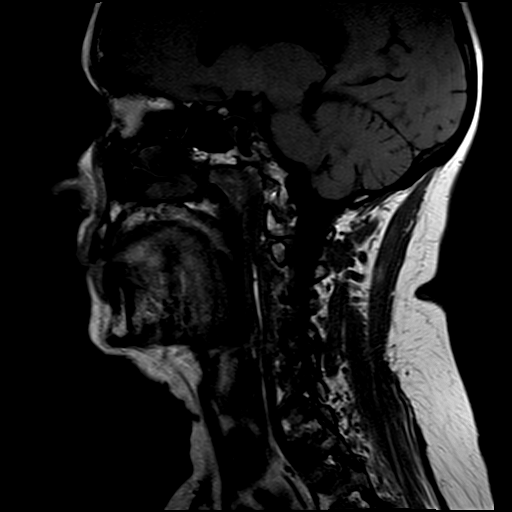
[im 10/15]
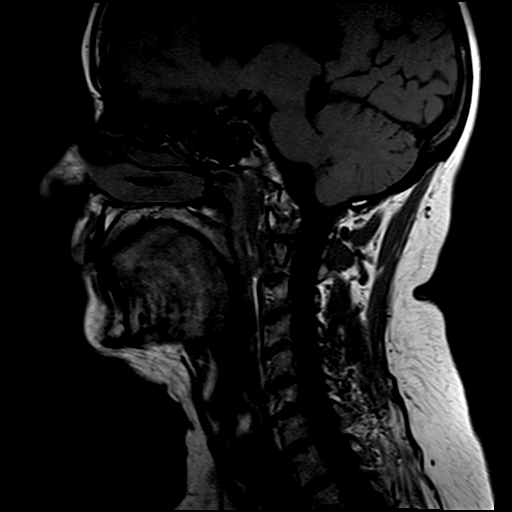
[im 15/15]
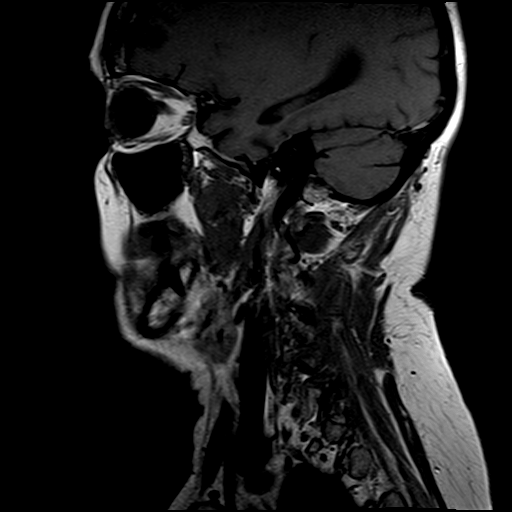

[Series 7: STIR · sagittal · 3.0mm · 0.47mm/px · 4 of 15 slices shown]
[im 1/15]
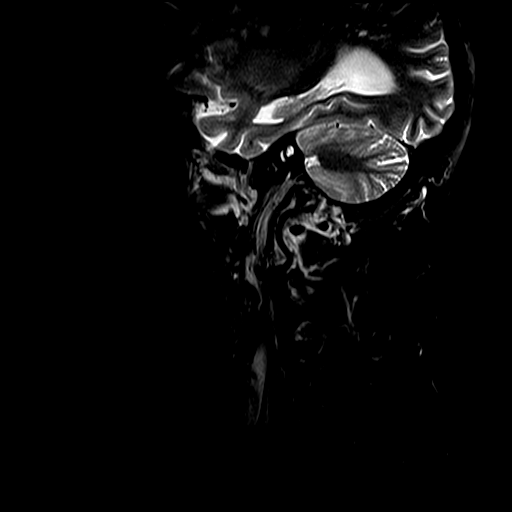
[im 5/15]
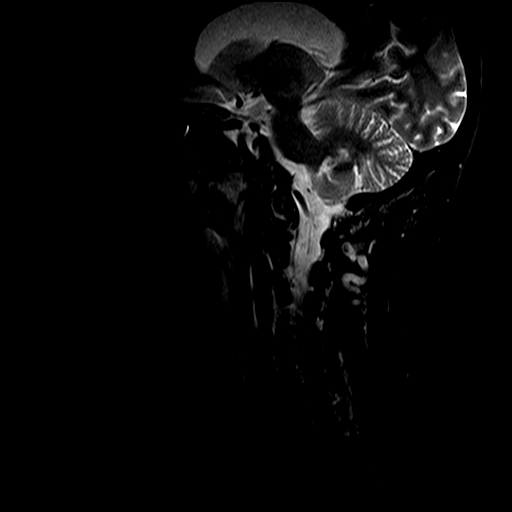
[im 10/15]
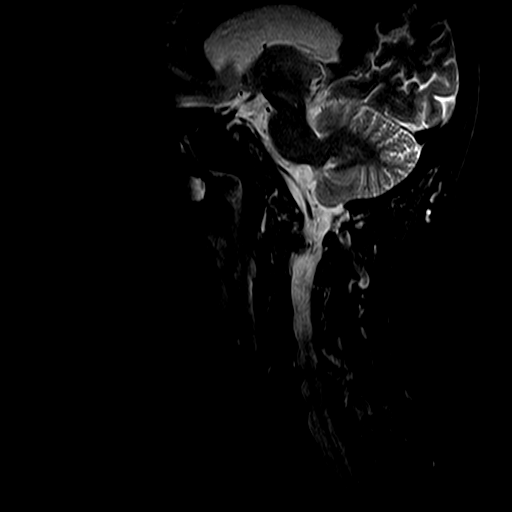
[im 15/15]
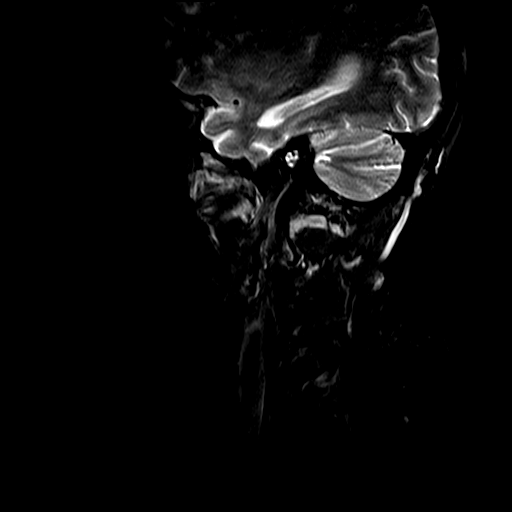

[Series 8: T2-star · axial · 3.0mm · 0.39mm/px · z∈[-130,-59]mm · 4 of 18 slices shown]
[im 1/18]
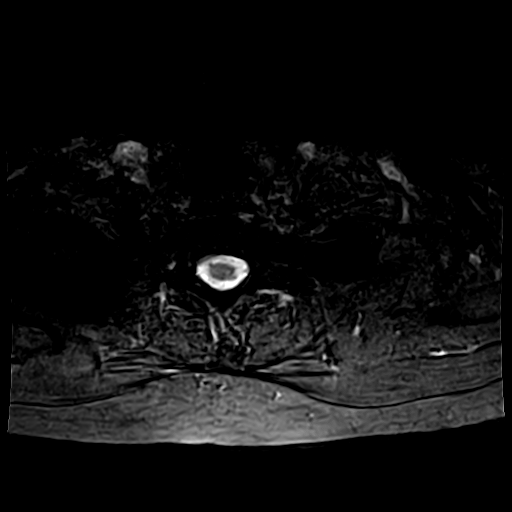
[im 5/18]
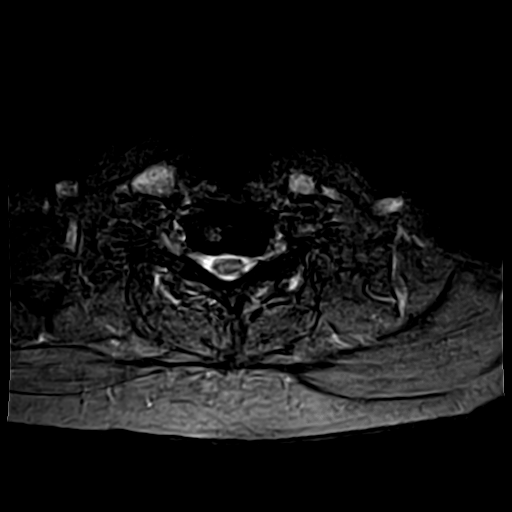
[im 9/18]
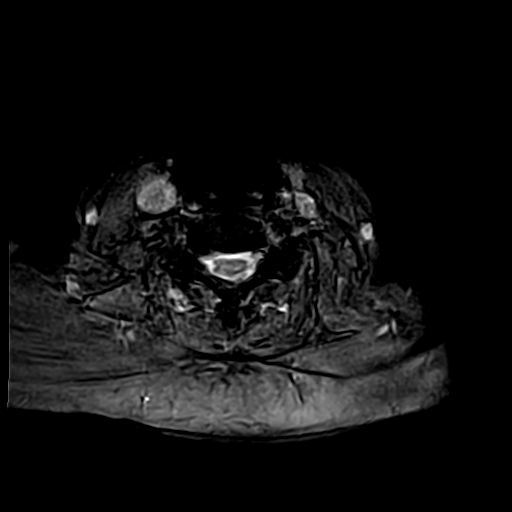
[im 13/18]
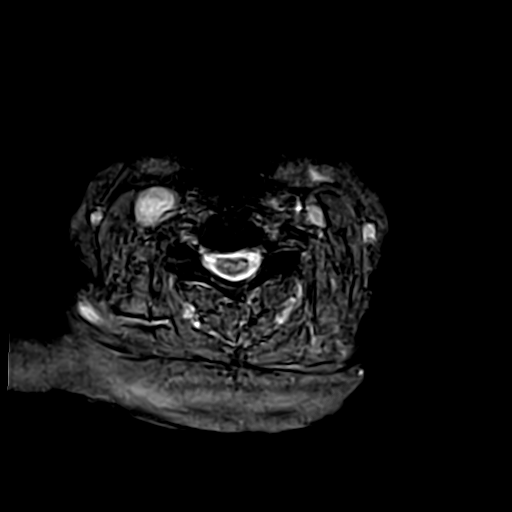

[Series 9: T2 · axial · 3.0mm · 0.39mm/px · z∈[-130,-34]mm · 5 of 18 slices shown (2 of 2)]
[im 1/18]
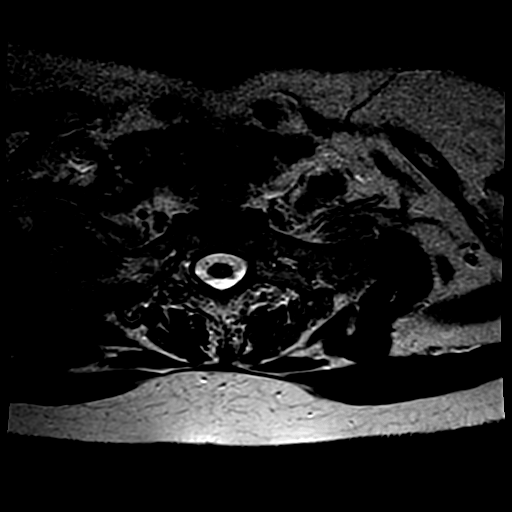
[im 5/18]
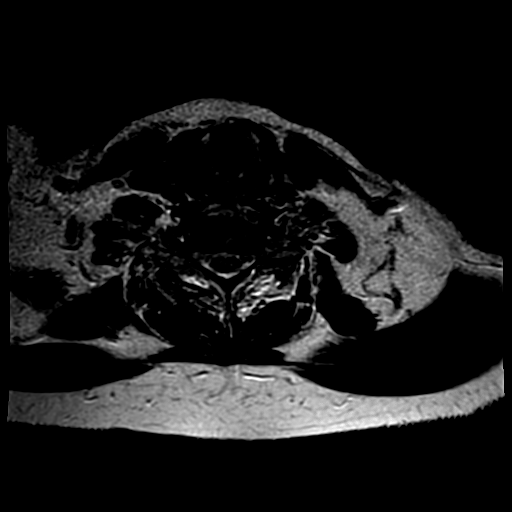
[im 9/18]
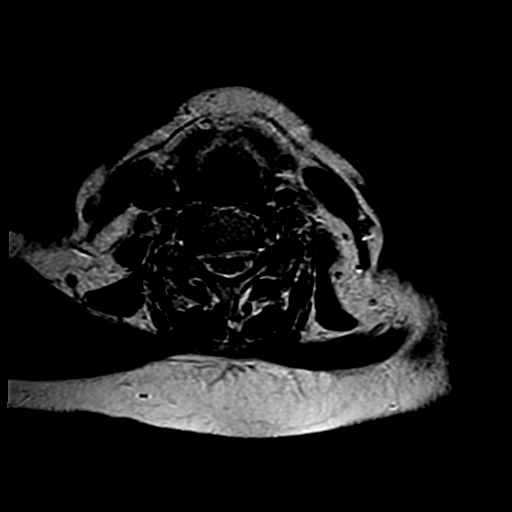
[im 13/18]
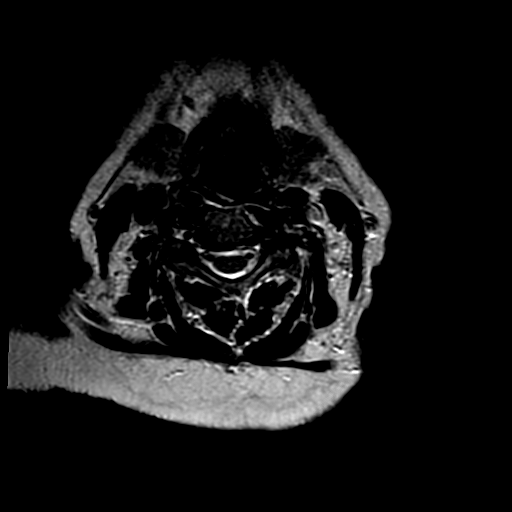
[im 18/18]
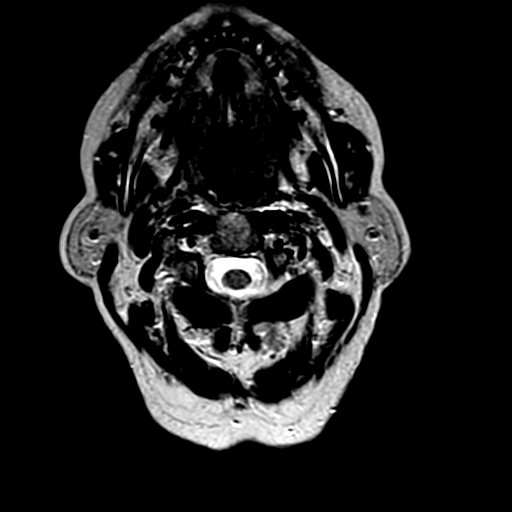

[29 of 48 positions shown; findings below may reference images not displayed]

FINDINGS: There are mild degenerative changes at multiple levels with mild disc bulging including C4-C5, C5-C6, and C6-C7.  There is slight compression of the thecal sac at these levels.  There is no spinal cord compression or significant spinal stenosis.  

There is no fracture or malalignment.  The spinal cord appears unremarkable.  There is no Chiari malformation.
IMPRESSION: 1. Mild degenerative disc disease with mild multilevel disc bulging.  

2. No fracture or disc herniation is seen.

## 2021-08-21 ENCOUNTER — Other Ambulatory Visit: Payer: Self-pay

## 2021-08-26 ENCOUNTER — Other Ambulatory Visit (HOSPITAL_COMMUNITY): Payer: Self-pay | Admitting: PSYCHIATRY AND NEUROLOGY-NEUROLOGY

## 2021-08-26 ENCOUNTER — Other Ambulatory Visit: Payer: Self-pay

## 2021-08-26 DIAGNOSIS — M542 Cervicalgia: Secondary | ICD-10-CM

## 2021-09-03 ENCOUNTER — Other Ambulatory Visit: Payer: Self-pay

## 2021-09-04 ENCOUNTER — Ambulatory Visit (HOSPITAL_COMMUNITY)
Admission: RE | Admit: 2021-09-04 | Discharge: 2021-09-04 | Disposition: A | Discharge status: Still In Hospital | Payer: Medicare Other | Source: Ambulatory Visit

## 2021-09-04 DIAGNOSIS — M542 Cervicalgia: Secondary | ICD-10-CM

## 2021-09-04 NOTE — PT Evaluation (Signed)
[facility]  Outpatient Physical Therapy  [place], [address]  [phone]  [phone]      Physical Therapy Cervical Evaluation    Date: 09/04/2021  Patient's Name: Bonnie Kline  Date of Birth: Nov 24, 1950    PT diagnosis/Reason for Referral: cervicalgia      SUBJECTIVE  Date of onset: 1 yr     Mechanism of injury: NKI came on suddenly, limited motion - driving difficult    Previous episodes/treatments: no     Medications for this problem: anti-inflammatory tylenol prn     Diagnostic tests: MRI at [facility] radiology - neck and brain  - DDD per patient report     PMH: high cholesterol, anxiety/depression, OSA on CPAP, hysterectomy, right ankle ORIF, left carpal tunnel, removal of 8 inch colon    Living situation: lives alone, 1 story home     Patient goals: REDUCE PAIN and NORMALIZE FUNCTION    Occupation: retired     Next MD visit: as needed     Pain location: right > left c/s                     Pain description: ACHING    Pain frequency:  INTERMITTENT    Pain rating: Now 4   Best 4   Worst 7    Radiculopathy: no    Pain increases with: turning her head, at night            decreases with : MEDICATION and uses neck C shaped pillow when sitting     Sensation: WFL     Weakness: sometimes     Sleep affected: takes her a while to go to sleep     Headaches: no     Dizziness:  No     PLOF: independent     Subjective Functional Reports:    Sitting: WFL    Standing: WFL    Walking: WFL    Lifting: LIMITED and 10 #         Patient-Specific Functional Score:    Problem Score   1. Turning head when driving  5   2. Getting sleep  7   3.         Total: 6          OBJECTIVE    AROM   right left   rotation 45 pain right lateral c/s, slight RSB at end of range 42 pulling pain on right    sidebend 22 22   flexion 40 pulling pain end range -------------------------   extension 40  -------------------------   T/S rotation 50% bilateral   Limited C7-T2 rotation noted  with c/s rotation    Strength     right left   Shrug (C3-4) [facility] WFL   Shoulder abduction (C5) [facility] WFL   Elbow flexion (C6) [facility] WFL   Elbow extension (C7) WFL WFL   Wrist extension (C6) WFL WFL   Wrist flexion (C7) WFL WFL   Thumb extension (C8) WFL WFL   Finger abduction (T1) WFL  WFL   Neck lat. flexion 3- 3-   Neck rotation 3- 3-    Neck flexion 3- ----------------------------------   Neck extension 3- ----------------------------------     Palpation: increased tone left > right suboccipitals, bilateral SCM, left > right lower c/s paraspinals and UT    Joint mobility: hypomobility left > right C6-T2    Posture: DECREASED LORDOSIS, FORWARD HEAD, DOWAGER'S HUMP  and right shoulder/scapula elevated/right handed     Reflexes    Biceps 2+ BUE   Triceps 2+ BUE   Brachioradialis 2+ BUE      Special tests Spurlings negative                       Distraction reported it felt good    Treatment provided:REVIEW OF POC AND GOALS WITH PATIENT, ALL QUESTIONS ANSWERED, PATIENT EDUCATION and MFR including Manual c/s traction and suboccipital release       ASSESSMENT    Impression: Patient presents with c/s pain x 1 yr NKI with DDD with forward head posture, limited c/s and t/s mobility, and pain affecting sleep and driving. Patient will benefit from PT services for ROM, strength, posture, MFR with HEP     Rehab potential: GOOD    Short Term Goals: 3 Weeks  -Independent home program. ROM / Stabilization / Self care / Activity modification.   -Decrease in pain. Intermittent vs. constant. Worst SPS rating less than 4.   -Increase ROM c/s by 5-10 degrees / Strength scapular stabilizers    -Improve function. Decreased sleep disruption   -Other compliant with HEP.    Long Term Goals: 6 Weeks   -Abolish radicular symptoms. Worst SPS rating less than 2.  -Increase ROM / Strength to [facility] to allow normal body mechanics with all mobility.  -Improve function. Restorative sleep /tolerance of static posture not limited by pain.   -Other patient  specific functional scale > 8            PLAN  Patient will attend 2 times per week x 4-6 weeks. Therapy may include, but is not limited to THERAPEUTIC EXERCISES, MYOFASCIAL/JOINT MOBILIZATION, POSTURE/BODY MECHANICS, HOME INSTRUCTIONS, ULTRASOUND, ELECTRICAL STIMULATION and KINESIOTAPE    Plan for next visit MFR, c/s and t/s mobility and exercises as tolerated        Evaluation complexity:   Personal factors impacting POC: LIVES ALONE and FREQUENT OR CHRONIC PAIN   Co-morbidities impacting POC: PSYCHIATRIC DIAGNOSIS  Complexity of physical exam: INCLUDING MUSCULOSKELETAL SYSTEM (POSTURE, ROM, STRENGTH, HEIGHT/WEIGHT)   Clinical Presentation: STABLE   Evaluation Complexity: LOW-HISTORY 0, EXAMINATION 1-2, STABLE PRESENTATION        Total Session Time 45 and Timed code minutes 45       Intervention minutes: EVALUATION 35 minutes and JOINT MOBILIZATION/MFR 10 minutes    Zenia Resides, PT  09/04/2021, 09:11        Start of Service: _________          Certification:    From:______  Through:_________    I certify the need for these services furnished under this plan of treatment and while under my care.    Referring Provider Signature: _______________     Date : _____________________

## 2021-09-10 ENCOUNTER — Ambulatory Visit (HOSPITAL_COMMUNITY)
Admission: RE | Admit: 2021-09-10 | Discharge: 2021-09-10 | Disposition: A | Discharge status: Still In Hospital | Payer: Medicare Other | Source: Ambulatory Visit

## 2021-09-10 ENCOUNTER — Other Ambulatory Visit: Payer: Self-pay

## 2021-09-10 NOTE — PT Treatment (Signed)
[facility]  Outpatient Physical Therapy  [place], [address]  [phone]  [phone]    Physical Therapy Treatment Note    Date: 09/10/2021  Patient's Name: Bonnie Kline  Date of Birth: [birth year]            Visit #/POC: 2/8-12: 6/28  Authorization: med [employer]      Evaluating Physical Therapist: Zenia Resides, PT  PT diagnosis/Reason for Referral: Cervicalgia  Next Scheduled Physician Appointment: will ask next visit.           Subjective: She notes treatment delivered at eval "made a big difference" with her ability to have improved sleep and relaxation of soft tissue in UT and cervical paraspinals. States she is using horseshoe type pillow to sleep that seems to support her neck better.     Objective: Treatment delivered as noted below.       EXERCISE/ACTIVITY NAME REPETITIONS RESISTANCE COMPLETED THIS DOS   Suboccipital release     yes   Manual cervical distraction     yes   Manual cervical stretching all directions   yes   Manual face clocks   yes   Manual anterior cervicalmobs   yes   bilat up/down slides   yes   Manual face clock  Active face clock     Yes  Yes: HEP 09/10/21   HEP education     yes                 Assessment: R>L with tightness during cervical up/down slides to reduce hypomobility in facet joints. She did have less stiffness with cervical spine in response to treatment. Post treatment she states neck feels as if there is increased mobility. She notes being  able to feel the tightness greater R with cervical spine and soft tissue. She had considerable difficulty performing active supine face clocks correctly, multiple cueing methods used to reinforce small movements to address C1&2. Handout given with verbal directions on performing face clock for home.      Plan:  Assess response to treatment. Will work with thoracic mobility next visit.     Total Session Time 45, Timed code minutes 45 and Untimed code minutes 0  THERAPEUTIC  EXERCISE 15 minutes and JOINT MOBILIZATION/MFR 30 minutes      Benjie Karvonen, PTA  09/10/2021, 09:04

## 2021-09-12 ENCOUNTER — Ambulatory Visit (HOSPITAL_COMMUNITY)
Admission: RE | Admit: 2021-09-12 | Discharge: 2021-09-12 | Disposition: A | Discharge status: Still In Hospital | Payer: Medicare Other | Source: Ambulatory Visit

## 2021-09-12 ENCOUNTER — Other Ambulatory Visit: Payer: Self-pay

## 2021-09-12 NOTE — PT Treatment (Signed)
[facility]  Outpatient Physical Therapy  [address]  [place], [address]  [phone]  (Fax) [phone]    Physical Therapy Treatment Note    Date: 09/12/2021  Patient's Name: Bonnie Kline  Date of Birth: 09-May-1950            Visit #/POC: 3/8-12: 6/28  Authorization: med [employer]      Evaluating Physical Therapist: Wyona Almas, PT  PT diagnosis/Reason for Referral: Cervicalgia  Next Scheduled Physician Appointment: will ask next visit.           Subjective: Good response to last visit, no soreness reported. Reports increased motion with R cervical ROT. Notes currently, slight ache in suboccipital region and along R side of head 3/10. Despite what direction she rotates she reports pain in more R sided.   Objective: Treatment delivered as noted below.       EXERCISE/ACTIVITY NAME REPETITIONS RESISTANCE COMPLETED THIS DOS   Suboccipital release     yes   Manual cervical distraction     yes   Manual cervical stretching all directions   yes   Manual face clocks   no   Manual anterior cervicalmobs   yes   bilat up/down slides  R side only yes   Manual face clock  Active face clock     no  no: HEP 09/10/21   HEP education     no   Prone thoracic PA mobs    Grade 2&3 yes   MFR: UT and levator  During manual stretch  yes   Seated U/T mob with cervical AROM   yes   Supine thoracic ROT with wand x10 with 2 sec hold  yes   MH to neck x10 min Post treatment yes         Assessment: Although she was not sore in response to last visit she was more tender R side with up/down slides. She presents with muscle tightness that did not feel as if the extensibility carried over from last visit. She does have some increases with PROM during manual stretching after working with U/T mobs in conjunction with cervical AROM.      Plan:  Assess response to treatment.     Total Session Time 53, Timed code 35 minutes  and Untimed code minutes 10  JOINT MOBILIZATION/MFR 35 minutes    Tarri Abernethy,  PTA

## 2021-09-17 ENCOUNTER — Ambulatory Visit (HOSPITAL_COMMUNITY)
Admission: RE | Admit: 2021-09-17 | Discharge: 2021-09-17 | Disposition: A | Discharge status: Still In Hospital | Payer: Medicare Other | Source: Ambulatory Visit

## 2021-09-17 ENCOUNTER — Other Ambulatory Visit: Payer: Self-pay

## 2021-09-17 NOTE — PT Treatment (Signed)
[facility]  Outpatient Physical Therapy  [place], [address]  [phone]  [phone]    Physical Therapy Treatment Note    Date: 09/17/2021  Patient's Name: Bonnie Kline  Date of Birth: [birth year]            Visit #/POC: 4/8-12: 6/28  Authorization: med [employer]ma [employer]      Evaluating Physical Therapist: Zenia Resides, PT  PT diagnosis/Reason for Referral: Cervicalgia  Next Scheduled Physician Appointment: will ask next visit.           Subjective: States only has experienced HA once since last visit. Continues to report her pain/tightness is in R UT and levator scap.    Objective: Treatment delivered as noted below.       EXERCISE/ACTIVITY NAME REPETITIONS RESISTANCE COMPLETED THIS DOS   Suboccipital release     no   Manual cervical distraction     no   Manual cervical stretching all directions   yes   Manual face clocks   no   Manual anterior cervicalmobs   no   bilat up/down slides  R side only no   Manual face clock  Active face clock     no  no: HEP 09/10/21   HEP education     no   Prone thoracic PA mobs    Grade 2&3 no   MFR: UT and levator  During manual stretch  no   Seated U/T mob with cervical AROM   no   Supine thoracic ROT with wand x10 with 2 sec hold  no   MH to neck x10 min Post treatment no   Combo: R UT, supra, levator, Rhomboids, teres, infrap X10 min Concentration UT, supra and levator yes   Kinesio tape: inhibitory I strip x3 UT, supra, levator yes               Assessment: Her chief c/o is with muscle tightness, initiated combo treatment and kinesio tape to address. There is still suspicion that R facet joints are stuck in closed position secondary to muscle tightness. Patient was educated that once muscle tightness is resolved the up/down slides should open the facet joints which will allow for increased cervical AROM.  Kinesio tape wear/care given.     Plan:  Assess effectiveness of today's treatment to reduce subjective reports of  muscle tightness greatest in R UT, supraspinatus and levator scap.     Total Session Time 34, Timed code 30 minutes  and Untimed code minutes 0  Combo x10 min MFR x20 min    Benjie Karvonen, PTA

## 2021-09-19 ENCOUNTER — Other Ambulatory Visit: Payer: Self-pay

## 2021-09-19 ENCOUNTER — Ambulatory Visit (HOSPITAL_COMMUNITY)
Admission: RE | Admit: 2021-09-19 | Discharge: 2021-09-19 | Disposition: A | Discharge status: Still In Hospital | Payer: Medicare Other | Source: Ambulatory Visit

## 2021-09-19 NOTE — PT Treatment (Signed)
[facility]  Outpatient Physical Therapy  [place], [address]  [phone]  [phone]    Physical Therapy Treatment Note    Date: 09/19/2021  Patient's Name: Bonnie Kline  Date of Birth: [birth year]      Visit #/POC: 5/8-12: 6/28  Authorization: [employer]      Evaluating Physical Therapist: Zenia Resides, PT  PT diagnosis/Reason for Referral: Cervicalgia  Next Scheduled Physician Appointment: will ask next visit.       Subjective: Pain level 4 today. Pulls/hurts/dull pain on right side - not sharp. Pain range 3-4 with the tape on (pain was 4-7 at [facility]). Neck is not waking her - uses CPAP,  Still difficult to look back when driving. Tape helped,  no irritation.     Objective: AROM c/s right rot 55 (was 45 at [facility]), left rot 50 (was 42 at [facility]), RSB 30 (was 22 at [facility]), LSB 28 (was 22 at [facility]), flex 50 (was 40 at [facility]), ext 40 (same as SOC)     EXERCISE/ACTIVITY NAME REPETITIONS RESISTANCE COMPLETED THIS DOS   Suboccipital release     no   Manual cervical distraction  Manual cervical stretching all directions   no      yes   Manual face clocks   no   Manual anterior cervicalmobs   no   bilat up/down slides  R side only no   Manual face clock  Active face clock     no  no: HEP 09/10/21   HEP education     no   Prone thoracic PA mobs    Grade 2&3 no   MFR: UT and levator  During manual stretch  no   Seated U/T mob with cervical AROM   Yes    Supine thoracic ROT with wand x10 with 2 sec hold  no   MH to neck x10 min Post treatment no   Combo: R UT, supra, levator, Rhomboids, teres, infrap X10 min Concentration UT, supra and levator yes   Kinesio tape: inhibitory I strip x3 UT, supra, levator yes   rotational PA mobs to C7- T3 seated  manual yes     Assessment: Patient is presenting with less pain and increased AROM all planes. Remains with limited rotation ROM bilaterally affecting driving but she reports it is improving. Patient  has met 2 of her STGs and is progressing on her others. Tolerated kinesiotape with reported less pain     PlShort Term Goals: 3 Weeks  -Independent home program. ROM / Stabilization / Self care / Activity modification.    Progressing                -Decrease in pain. Intermittent vs. constant. Worst SPS rating less than 4.   Pain level 3-4 on 09/19/21               -Increase ROM c/s by 5-10 degrees / Strength scapular stabilizers   MET 09/19/21                -Improve function. Decreased sleep disruption   MET 09/19/21                -Other compliant with HEP.   Progressing    Long Term Goals: 6 Weeks               -Abolish radicular symptoms. Worst SPS rating less than 2.  -Increase ROM / Strength to [facility] to allow  normal body mechanics with all mobility.  -Improve function. Restorative sleep /tolerance of static posture not limited by pain.               -Other patient specific functional scale > 8  Plan:  Continue PT per POC     Total Session Time 40 and Timed code minutes 40  THERAPEUTIC EXERCISE 20 minutes, JOINT MOBILIZATION/MFR 10 minutes and ULTRASOUND/E-STIM COMBO 10 minutes      Zenia Resides, PT  09/19/2021, 10:47

## 2021-09-24 ENCOUNTER — Other Ambulatory Visit: Payer: Self-pay

## 2021-09-24 ENCOUNTER — Ambulatory Visit (HOSPITAL_COMMUNITY)
Admission: RE | Admit: 2021-09-24 | Discharge: 2021-09-24 | Disposition: A | Discharge status: Still In Hospital | Payer: Medicare Other | Source: Ambulatory Visit

## 2021-09-24 NOTE — PT Treatment (Signed)
[facility]  Outpatient Physical Therapy  [place], [address]  [phone]  [phone]    Physical Therapy Treatment Note    Date: 09/24/2021  Patient's Name: Bonnie Kline  Date of Birth: 11-11-1950      Visit #/POC: 6/8-12: 6/28  Authorization: [employer]      Evaluating Physical Therapist: Zenia Resides, PT  PT diagnosis/Reason for Referral: Cervicalgia  Next Scheduled Physician Appointment: will ask next visit.       Subjective: Upon arrival reports pain 3-4/10 and describes it as dull pulling pain with movements. Uses C pillow around neck when sitting watching TV.     Objective: Treatment delivered as noted below.     EXERCISE/ACTIVITY NAME REPETITIONS RESISTANCE COMPLETED THIS DOS   Suboccipital release     no   Manual cervical distraction  Manual cervical stretching all directions   No  no         Manual face clocks   no   Manual anterior cervicalmobs   no   bilat up/down slides  R side only no   Manual face clock  Active face clock     no  no: HEP 09/10/21   HEP education     no   Prone thoracic PA mobs    Grade 2&3 no   MFR: UT and levator   --R>L UT, supra and levator During manual stretch  seated   manual No  yes   Seated U/T mob with cervical AROM   no   Supine thoracic ROT with wand x10 with 2 sec hold  no   MH to neck x10 min Post treatment no   Combo: R UT, supra, levator, Rhomboids, teres, infrap X8 min Concentration UT, supra and levator yes   Kinesio tape: inhibitory I strip x3 R UT, supra, levator yes   rotational PA mobs to C7- T3 seated  manual no   HEP education   yes     Assessment: She has large trigger knot in R UT that is not releasing well. Performed MFR of skin rolling as noted above after Combo treatment to further reduce soft tissue tightness that impedes cervical mobility. She was shown and then provided return demonstration for indep cervical stretching with HEP. Minor corrections required  for correct technique and illustrated handout given. Post treatment she continues to report pulling in R UT with cervical ROT L.       PlShort Term Goals: 3 Weeks  -Independent home program. ROM / Stabilization / Self care / Activity modification.    Progressing                -Decrease in pain. Intermittent vs. constant. Worst SPS rating less than 4.   Pain level 3-4 on 09/19/21               -Increase ROM c/s by 5-10 degrees / Strength scapular stabilizers   MET 09/19/21                -Improve function. Decreased sleep disruption   MET 09/19/21                -Other compliant with HEP.   Progressing    Long Term Goals: 6 Weeks               -Abolish radicular symptoms. Worst SPS rating less than 2.  -Increase ROM / Strength to [facility] to allow normal body mechanics with all  mobility.  -Improve function. Restorative sleep /tolerance of static posture not limited by pain.               -Other patient specific functional scale > 8    Plan: Assess response to treatment and effectiveness of HEP to further target soft tissue tightness.     Total Session Time 46 and Timed 44 code minutes   JOINT MOBILIZATION/MFR 36 minutes and ULTRASOUND/E-STIM COMBO 8 minutes (kinesio tape and HEP training time included with MFR)    Benjie Karvonen, PTA

## 2021-09-26 ENCOUNTER — Ambulatory Visit (HOSPITAL_COMMUNITY)
Admission: RE | Admit: 2021-09-26 | Discharge: 2021-09-26 | Disposition: A | Discharge status: Still In Hospital | Payer: Medicare Other | Source: Ambulatory Visit

## 2021-09-26 ENCOUNTER — Other Ambulatory Visit: Payer: Self-pay

## 2021-09-26 NOTE — PT Treatment (Signed)
[facility]  Outpatient Physical Therapy  [place], [address]  [phone]  [phone]    Physical Therapy Treatment Note    Date: 09/26/2021  Patient's Name: Bonnie Kline  Date of Birth: 11-30-1950      Visit #/POC: 7/8-12: 6/28  Authorization: [employer]      Evaluating Physical Therapist: Zenia Resides, PT  PT diagnosis/Reason for Referral: Cervicalgia  Next Scheduled Physician Appointment: will ask next visit.       Subjective: She reports continued tightness R>L UT. Since [facility] she feels she is 50% improved. Currently, unsure if the cervical stretches at home are helping. Average pain recently 4-5/10.     Objective: Treatment delivered as noted below.     EXERCISE/ACTIVITY NAME REPETITIONS RESISTANCE COMPLETED THIS DOS   Suboccipital release     no   Manual cervical distraction  Manual cervical stretching all directions   No  no         Manual face clocks   no   Manual anterior cervicalmobs   no   bilat up/down slides  R side only no   Manual face clock  Active face clock     no  no: HEP 09/10/21   Prone thoracic PA mobs    Grade 2&3 no   MFR: UT and levator   --R UT trigger release During manual stretch  seated   manual No  yes   Seated U/T mob with cervical AROM   no   Supine thoracic ROT with wand x10 with 2 sec hold  no   MH to neck x10 min Post treatment no   Combo: R UT, supra, levator, Rhomboids, teres, infrap X8 min Concentration UT, supra and levator no   Kinesio tape: inhibitory  --GH mechanical correction (R) I strip x3  I strip x2 R UT, supra, levator No  yes   rotational PA mobs to C7- T3 seated  manual no   HEP education   yes   Snag ROT with towel X2 ea side with 10 sec hold  Yes: HEP 09/26/21                 Assessment: Patient continues to have trigger knot that involves R UT and supraspinatus, extensive time was given on performing release. The trigger knot does release, but not completely. Kinesio tape  initiated as stated above to place R shoulder in correct position offering relaxation to the UT with hopes to encourage additional resolve of trigger knot. Also initiated ROT stretching with towel (snag) as active cervical stretches given in prior visits do not seem to be very effective in reducing soft tissue tightness secondary to the tightness continues to be persistent. She does report feeling as if the snag stretch using towel when performed into R ROT did seem to reduce R sided tightness some.     PlShort Term Goals: 3 Weeks  -Independent home program. ROM / Stabilization / Self care / Activity modification.    Progressing                -Decrease in pain. Intermittent vs. constant. Worst SPS rating less than 4.   Pain level 3-4 on 09/19/21               -Increase ROM c/s by 5-10 degrees / Strength scapular stabilizers   MET 09/19/21                -Improve function.  Decreased sleep disruption   MET 09/19/21                -Other compliant with HEP.   Progressing    Long Term Goals: 6 Weeks               -Abolish radicular symptoms. Worst SPS rating less than 2.  -Increase ROM / Strength to [facility] to allow normal body mechanics with all mobility.  -Improve function. Restorative sleep /tolerance of static posture not limited by pain.               -Other patient specific functional scale > 8    Plan: Assess response to snag ROT stretching with towel, effectiveness of kinesio tape for GH joint positioning and will initiate scapular strengthening next visit. Will also assess goals and objectives.     Total Session Time 42 and Timed 42 code minutes   JOINT MOBILIZATION/MFR 30 minutes and exercise x12 min (kinesio tape time included with MFR)    Benjie Karvonen, PTA

## 2021-10-01 ENCOUNTER — Ambulatory Visit (HOSPITAL_COMMUNITY): Payer: Self-pay

## 2021-10-03 ENCOUNTER — Ambulatory Visit
Admission: RE | Admit: 2021-10-03 | Discharge: 2021-10-03 | Disposition: A | Discharge status: Still In Hospital | Payer: Medicare Other | Source: Ambulatory Visit | Attending: PSYCHIATRY AND NEUROLOGY-NEUROLOGY | Admitting: PSYCHIATRY AND NEUROLOGY-NEUROLOGY

## 2021-10-03 ENCOUNTER — Other Ambulatory Visit (HOSPITAL_COMMUNITY): Payer: Self-pay

## 2021-10-03 ENCOUNTER — Other Ambulatory Visit: Payer: Self-pay

## 2021-10-03 DIAGNOSIS — Z780 Asymptomatic menopausal state: Secondary | ICD-10-CM

## 2021-10-03 NOTE — PT Treatment (Signed)
[facility]  Outpatient Physical Therapy  [place], [address]  [phone]  [phone]    Physical Therapy Treatment Note    Date: 10/03/2021  Patient's Name: Bonnie Kline  Date of Birth: 10-09-1950      Visit #/POC: 8/8-12: 6/28  Authorization: [employer]      Evaluating Physical Therapist: Zenia Resides, PT  PT diagnosis/Reason for Referral: Cervicalgia  Next Scheduled Physician Appointment:       Subjective: Patient had RTD yesterday and informed PCP that she is progressing with decreased symptoms since [facility]. States she fell off her porch yesterday into her bushes without injury. Fall was d/t accidentally having foot too close to the edge of her porch. She continues to feel the "snag" rotation is helping to reduce muscle tightness, this application of kinesio tape did not seem as beneficial. Continues to report pulling in R UT and cervical paraspinals primarily with ROT. Will be having bone density test in the next week or so.     Objective: Treatment delivered as noted below.     Cervical AROM: ROT R 57 degrees with no deviation into SB and less pulling, ROT L 55 degrees with some decreases in pulling;  SB: R 36 degrees, L 30 degrees; flexion and extension 50 degrees with no pulling.    Patient-Specific Functional Score:    Problem Score                               10/03/21   1. Turning head when driving  5                                          7   2. Getting sleep  7                                          8   3.         Total: 6                                           7.5       Strength     Right                     10/03/21 Left                           10/03/21   Shrug (C3-4) [facility] WFL   Shoulder abduction (C5) [facility] WFL   Elbow flexion (C6) [facility] WFL   Elbow extension (C7) WFL WFL   Wrist extension (C6) WFL WFL   Wrist flexion (C7) WFL WFL   Thumb extension (C8) WFL WFL   Finger abduction (T1) WFL  WFL   Neck lat. flexion 3-                                 5 3-  4+   Neck rotation 3-                                5 3-                                 5   Neck flexion 3-                                4 ----------------------------------   Neck extension 3-                                4+ ----------------------------------           EXERCISE/ACTIVITY NAME REPETITIONS RESISTANCE COMPLETED THIS DOS   Suboccipital release     no   Manual cervical distraction  Manual cervical stretching all directions   No  no         Manual face clocks   no   Manual anterior cervicalmobs   no   bilat up/down slides  R side only no   Manual face clock  Active face clock     no  no: HEP 09/10/21   Prone thoracic PA mobs    Grade 2&3 no   MFR: UT and levator   --R UT trigger release During manual stretch  seated   manual No  no   Seated U/T mob with cervical AROM   no   Supine thoracic ROT with wand x10 with 2 sec hold  no   MH to neck x10 min Post treatment no   Combo: R UT, supra, levator, Rhomboids, teres, infrap X8 min Concentration UT, supra and levator no   Kinesio tape: inhibitory  --GH mechanical correction (R) I strip x3  I strip x2 R UT, supra, levator No  no   rotational PA mobs to C7- T3 seated  manual no   HEP education   no   Snag ROT with towel X2 ea side with 10 sec hold  no: HEP 09/26/21   Supine: suboccipital release using miracle ball 5 min  yes   Supine static assessment of Buckwheat pillow   yes   Re-assessment of goals and objectives   yes     Assessment: She notes buckwheat pillow feels very similar to her "C" pillow that she uses for sleep (pillow is such of a small neck support during airplane rides). Pain is reported to be with ROT and at worst still 3-4/10, she also states she is a very stressed type of person and the last few nights feels her stress has been more intense. While laying on buckwheat pillow and rotating head she reports less discomfort and notes the pillow feels as though it is  keeping cervical spine in proper alignment. Purchase information given. She has increased cervical AROM, MMT and PSFS since [facility]. She continues to report pulling R>L UT and cervical paraspinals. Also worked with miracle ball for suboccipital release.     Plan: Assess effectiveness of today's treatment to further reduce soft tissue tightness. Assess if she has decided to purchase buckwheat pillow. Did miracle ball help reduce suboccipital release.     Short Term Goals: 3 Weeks  -Independent home program. ROM / Stabilization / Self care / Activity  modification.    (MET 10/03/21)               -Decrease in pain. Intermittent vs. constant. Worst SPS rating less than 4.   (Pain level 3-4 on 10/03/21, but goal partially met as pain is intermittent-only with rotation)               -Increase ROM c/s by 5-10 degrees / Strength scapular stabilizers   MET 09/19/21                -Improve function. Decreased sleep disruption   MET 09/19/21                -Other compliant with HEP.   (MET 10/03/21)    Long Term Goals: 6 Weeks               -Abolish radicular symptoms. Worst SPS rating less than 2. (UNMET 10/03/21)  -Increase ROM / Strength to [facility] to allow normal body mechanics with all mobility. (UNMET6/8/23)  -Improve function. Restorative sleep /tolerance of static posture not limited by pain. (Progressing 10/03/21)               -Other patient specific functional scale > 8 (Progressing 10/03/21)      Total Session Time 53 min and Timed  Code 50 minutes   therex x50 min    Benjie Karvonen, PTA

## 2021-10-08 ENCOUNTER — Ambulatory Visit (HOSPITAL_COMMUNITY)
Admission: RE | Admit: 2021-10-08 | Discharge: 2021-10-08 | Disposition: A | Discharge status: Still In Hospital | Payer: Medicare Other | Source: Ambulatory Visit

## 2021-10-08 ENCOUNTER — Other Ambulatory Visit: Payer: Self-pay

## 2021-10-08 DIAGNOSIS — M542 Cervicalgia: Secondary | ICD-10-CM | POA: Insufficient documentation

## 2021-10-08 NOTE — PT Treatment (Signed)
[facility]  Outpatient Physical Therapy  [place], [address]  [phone]  [phone]    Physical Therapy Treatment Note    Date: 10/08/2021  Patient's Name: Bonnie Kline  Date of Birth: 08-15-1950    Visit #/POC: 8/8-12: 6/28  Authorization: med [employer]      Evaluating Physical Therapist: Zenia Resides, PT  PT diagnosis/Reason for Referral: Cervicalgia  Next Scheduled Physician Appointment:       Subjective: Pain level today 3 - right lateral neck. Reports intermittent knot right lower neck.  Pain range 2-4 (worse after her fall). Likes the Buckwheat pillow but has not ordered on. Patient feels she has improved 35-40%       Objective: AROM c/s RRot 55, Lrot 55,  RSB 40, LSB 35, flex 55, ext 45   Reassessment followed by MFR to bilateral Upper traps and therex per flow sheet   EXERCISE/ACTIVITY NAME REPETITIONS RESISTANCE COMPLETED THIS DOS   Suboccipital release     no   Manual cervical distraction  Manual cervical stretching all directions   No  no         Manual face clocks   no   Manual anterior cervicalmobs   no   bilat up/down slides  R side only no   Manual face clock  Active face clock     no  no: HEP 09/10/21   Prone thoracic PA mobs    Grade 2&3 no   MFR: UT and levator   --R UT trigger release During manual stretch  seated   manual No  no   Seated U/T mob with cervical AROM   no   Supine thoracic ROT with wand x10 with 2 sec hold  no   MH to neck x10 min Post treatment no   Combo: R UT, supra, levator, Rhomboids, teres, infrap X8 min Concentration UT, supra and levator no   Kinesio tape: inhibitory  --GH mechanical correction (R) I strip x3  I strip x2 R UT, supra, levator No  no   rotational PA mobs to C7- T3 seated  manual no   HEP education   no   Snag ROT with towel X2 ea side with 10 sec hold  no: HEP 09/26/21   Supine: suboccipital release using miracle ball 5 min  yes   Supine static assessment of Buckwheat  pillow  Neck rotation on buckwheat pillow   yes   Re-assessment of goals and objectives   yes   C/s retraction   5 x 2 seconds    yes   Seated ER   2 x10  yellow  yes     Patient-Specific Functional Score:    Problem Score                               10/03/21   1. Turning head when driving  5                                          7   2. Getting sleep  7  8   3.         Total: 6                                           7.5    HEP    Access Code: [email or web address]  URL: [email or web address]  Date: 10/08/2021  Prepared by: Zenia Resides    Exercises  - Seated Bilateral Shoulder External Rotation with Resistance  - 1 x daily - 7 x weekly - 2 sets - 10 reps        Assessment: Increased AROM and strength c/s. Weakness noted scapular retractors as apparent rounded posture in sitting. She is progressing towards goals  Short Term Goals: 3 Weeks  -Independent home program. ROM / Stabilization / Self care / Activity modification.    (MET 10/03/21)               -Decrease in pain. Intermittent vs. constant. Worst SPS rating less than 4.   (Pain level 3-4 on 10/03/21, but goal partially met as pain is intermittent-only with rotation)               -Increase ROM c/s by 5-10 degrees / Strength scapular stabilizers   MET 09/19/21                -Improve function. Decreased sleep disruption   MET 09/19/21                -Other compliant with HEP.   (MET 10/03/21)    Long Term Goals: 6 Weeks    -Abolish radicular symptoms. Worst SPS rating less than 2.  Pain level 2-4, Met for radicular symptoms 10/08/21   -Increase ROM / Strength to [facility] to allow normal body mechanics with all mobility. Progressing 10/08/21   -Improve function. Restorative sleep /tolerance of static posture not limited by pain. (Progressing 10/03/21)               -Other patient specific functional scale > 8 (Progressing 10/03/21)    Plan: focus on strengthening of postural muscles and reassess next week    Total Session Time 40  and Timed code minutes 40  THERAPEUTIC EXERCISE 25 minutes and JOINT MOBILIZATION/MFR 15 minutes      Zenia Resides, PT  10/08/2021, 14:37

## 2021-10-11 ENCOUNTER — Other Ambulatory Visit: Payer: Self-pay

## 2021-10-11 ENCOUNTER — Ambulatory Visit (HOSPITAL_COMMUNITY)
Admission: RE | Admit: 2021-10-11 | Discharge: 2021-10-11 | Disposition: A | Discharge status: Still In Hospital | Payer: Medicare Other | Source: Ambulatory Visit

## 2021-10-11 NOTE — PT Treatment (Signed)
[facility]  Outpatient Physical Therapy  [place], [address]  [phone]  [phone]    Physical Therapy Treatment Note    Date: 10/11/2021  Patient's Name: Bonnie Kline  Date of Birth: [birth year]    Visit #/POC: 10/8-12: 6/28  Authorization: med [employer]      Evaluating Physical Therapist: Zenia Resides, PT  PT diagnosis/Reason for Referral: Cervicalgia  Next Scheduled Physician Appointment: unsure      Subjective: She states neck feels really good. Notes still having pull with cervical ROT R>L proximal to mid aspects of UT, levator scap and cervical paraspinals. Pain is rated to be 4/10 upon arrival.       Objective:   EXERCISE/ACTIVITY NAME REPETITIONS RESISTANCE COMPLETED THIS DOS   Suboccipital release     no   Manual cervical distraction  Manual cervical stretching all directions   No  yes         Manual face clocks   no   Manual anterior cervicalmobs   no   bilat up/down slides  R side only no   Manual face clock  Active face clock     no  no: HEP 09/10/21   Prone thoracic PA mobs    Grade 2&3 no   MFR: UT and levator   --R UT trigger release During manual stretch  seated   manual No  no   Seated U/T mob with cervical AROM   no   Supine thoracic ROT with wand x10 with 2 sec hold  no   MH to neck x10 min Post treatment no   Combo: R UT, supra, levator, Rhomboids, teres, infrap X8 min Concentration UT, supra and levator no   Kinesio tape: inhibitory  --GH mechanical correction (R) I strip x3  I strip x2 R UT, supra, levator No  no   rotational PA mobs to C7- T3 seated  manual no   HEP education   no   Snag ROT with towel X2 ea side with 10 sec hold  no: HEP 09/26/21   Supine: suboccipital release using miracle ball 5 min  no   Supine static assessment of Buckwheat pillow  Neck rotation on buckwheat pillow   no   Re-assessment of goals and objectives   no   C/s retraction   5 x 2 seconds    yes   Seated ER   2 x10  yellow   no: HEP 10/08/21   UBE x5 min  70 rpm yes   Supine: horizontal abd    --shoulder extension    --supine D2 flexion x30    x30    x20 Red Tband    Red Tband    Yellow Tband Yes: HEP 10/11/21  Yes: HEP 10/11/21  Yes: HEP 10/11/21     Patient-Specific Functional Score:    Problem Score                               10/03/21   1. Turning head when driving  5                                          7   2. Getting sleep  7  8   3.         Total: 6                                           7.5     Access Code: [email or web address]  URL: [email or web address]  Date: 10/11/2021  Prepared by: Benjie Karvonen    Exercises  - Supine Shoulder Horizontal Abduction with Resistance  - 1 x daily - 7 x weekly - 1 sets - 30 reps  - Supine Shoulder Extension with Anchored Resistance  - 1 x daily - 7 x weekly - 1 sets - 30 reps  - Supine PNF D2 Flexion with Resistance  - 1 x daily - 7 x weekly - 1 sets - 20 reps    Assessment: UBE is reported to be pulling on "back of arms." Much cueing required for correct technique with supine postural strengthening exercises initiated this day. After working with strengthening exercises she states neck muscles feel more tight than upon arrival. Manual cervical stretching at end of session finds considerable tightness R>L, but does loosen rather quickly with stretching. Patient was instructed to perform cervical stretches prior and post exercise to assess if this allows for better tolerance to strengthening. When leaving she states neck pain is less than a 4, but does not give numerical value. Illustrated handout given adding today's exercises to HEP, written cues for correct technique added to paper.     Short Term Goals: 3 Weeks  -Independent home program. ROM / Stabilization / Self care / Activity modification.    (MET 10/03/21)               -Decrease in pain. Intermittent vs. constant. Worst SPS rating less than 4.   (Pain level 3-4 on 10/03/21, but goal partially met as  pain is intermittent-only with rotation)               -Increase ROM c/s by 5-10 degrees / Strength scapular stabilizers   MET 09/19/21                -Improve function. Decreased sleep disruption   MET 09/19/21                -Other compliant with HEP.   (MET 10/03/21)    Long Term Goals: 6 Weeks    -Abolish radicular symptoms. Worst SPS rating less than 2.  Pain level 2-4, Met for radicular symptoms 10/08/21   -Increase ROM / Strength to [facility] to allow normal body mechanics with all mobility. Progressing 10/08/21   -Improve function. Restorative sleep /tolerance of static posture not limited by pain. (Progressing 10/03/21)               -Other patient specific functional scale > 8 (Progressing 10/03/21)    Plan: Will re-assess next week on effectiveness of postural strengthening to decrease pain/symptoms.    Total Session Time 46 and Timed code 46 minutes   THERAPEUTIC EXERCISE 46 minutes      Benjie Karvonen, PTA

## 2021-10-15 ENCOUNTER — Ambulatory Visit (HOSPITAL_COMMUNITY)
Admission: RE | Admit: 2021-10-15 | Discharge: 2021-10-15 | Disposition: A | Discharge status: Still In Hospital | Payer: Medicare Other | Source: Ambulatory Visit

## 2021-10-15 ENCOUNTER — Other Ambulatory Visit: Payer: Self-pay

## 2021-10-15 NOTE — PT Treatment (Signed)
[facility]  Outpatient Physical Therapy  [place], [address]  [phone]  [phone]    Physical Therapy Treatment Note    Date: 10/15/2021  Patient's Name: Bonnie Kline  Date of Birth: 11-04-1950    Visit #/POC: 11/8-12: 6/28  Authorization: med [employer]      Evaluating Physical Therapist: Zenia Resides, PT  PT diagnosis/Reason for Referral: Cervicalgia  Next Scheduled Physician Appointment: unsure      Subjective: States this morning she feels really good. Has received her buckwheat on Sunday and has experienced improved sleep since then. Currently, feeling the best she has felt in a while, notes stiffness without pain in bilat scalenes.     Objective:   EXERCISE/ACTIVITY NAME REPETITIONS RESISTANCE COMPLETED THIS DOS   Suboccipital release     no   Manual cervical distraction  Manual cervical stretching all directions  Manual scalene stretching  Seated active scalene stretching   yes  Yes    Yes  yes         Manual face clocks   no   Manual anterior cervicalmobs   no   bilat up/down slides  bilat yes   Manual face clock  Active face clock     no  no: HEP 09/10/21   Prone thoracic PA mobs    Grade 2&3 no   MFR: UT and levator   --R UT trigger release During manual stretch  seated   manual No  no   Seated U/T mob with cervical AROM   no   Supine thoracic ROT with wand x10 with 2 sec hold  no   MH to neck x10 min Post treatment no   Combo: R UT, supra, levator, Rhomboids, teres, infrap X8 min Concentration UT, supra and levator no   Kinesio tape: inhibitory  --GH mechanical correction (R) I strip x3  I strip x2 R UT, supra, levator No  no   rotational PA mobs to C7- T3 seated  manual no   HEP education  Review only yes   Snag ROT with towel X2 ea side with 10 sec hold  no: HEP 09/26/21   Supine: suboccipital release using miracle ball 5 min  no   Supine static assessment of Buckwheat pillow  Neck rotation on buckwheat pillow    no   Re-assessment of goals and objectives   no   C/s retraction   5 x 2 seconds    no   Seated ER   2 x10  yellow  no: HEP 10/08/21   UBE x5 min  70 rpm yes   Supine: horizontal abd    --shoulder extension    --supine D2 flexion x30    x30    x20 Red Tband    Red Tband    Yellow Tband no: HEP 10/11/21  no: HEP 10/11/21  no: HEP 10/11/21     Patient-Specific Functional Score:    Problem Score                               10/15/21   1. Turning head when driving  5                                          7   2. Getting sleep 7  8   3.         Total: 6                                           7.5       Assessment: Post treatment patient indicates area of concern is in bilat UT into cervical paraspinals, however upon start of treatment it seemed as if she was indicating by palpating/rubbing bilat scalene tightness as chief c/o. Worked with scalene stretching in seated with AA for teaching then active performance, scalene tightness was further addressed manually in supine. At end of session upon sitting EOM she is dizzy and reports had begun to get nauseated towards end of manual work. She was educated the stretching most likely aggravated her vestibular system. After short time of sitting EOM dizziness and nausea are reported to have resolved. Was going to give scalene stretching as HEP, but patient does have difficulty with correct technique and seemed to be overwhelmed. She was instructed between this visit and her next to assess if today's stretching offered any resolve to her symptoms.     Short Term Goals: 3 Weeks  -Independent home program. ROM / Stabilization / Self care / Activity modification.    (MET 10/03/21)               -Decrease in pain. Intermittent vs. constant. Worst SPS rating less than 4.   (Pain level 3-4 on 10/03/21, but goal partially met as pain is intermittent-only with rotation)               -Increase ROM c/s by 5-10 degrees / Strength scapular stabilizers    MET 09/19/21                -Improve function. Decreased sleep disruption   MET 09/19/21                -Other compliant with HEP.   (MET 10/03/21)    Long Term Goals: 6 Weeks    -Abolish radicular symptoms. Worst SPS rating less than 2.  Pain level 2-4, Met for radicular symptoms 10/08/21   -Increase ROM / Strength to [facility] to allow normal body mechanics with all mobility. Progressing 10/08/21   -Improve function. Restorative sleep /tolerance of static posture not limited by pain. (Progressing 10/03/21)               -Other patient specific functional scale > 8 (Progressing 10/03/21)    Plan: Assess response to treatment this day and effectiveness to address symptoms of tightness with active cervical rotation. Will address bilat UT next visit and work with cervical retraction and postural strengthening.     Total Session Time 44 and Timed code 40 minutes   THERAPEUTIC EXERCISE 40 minutes      Benjie Karvonen, PTA  10/15/2021 08:57

## 2021-10-16 ENCOUNTER — Ambulatory Visit
Admission: RE | Admit: 2021-10-16 | Discharge: 2021-10-16 | Disposition: A | Discharge status: Home / Self Care | Payer: Medicare Other | Source: Ambulatory Visit | Attending: FAMILY PRACTICE | Admitting: FAMILY PRACTICE

## 2021-10-16 DIAGNOSIS — M85851 Other specified disorders of bone density and structure, right thigh: Secondary | ICD-10-CM

## 2021-10-16 DIAGNOSIS — M85852 Other specified disorders of bone density and structure, left thigh: Secondary | ICD-10-CM

## 2021-10-16 DIAGNOSIS — Z780 Asymptomatic menopausal state: Secondary | ICD-10-CM | POA: Insufficient documentation

## 2021-10-18 ENCOUNTER — Ambulatory Visit
Admission: RE | Admit: 2021-10-18 | Discharge: 2021-10-18 | Disposition: A | Discharge status: Still In Hospital | Payer: Medicare Other | Source: Ambulatory Visit | Attending: PSYCHIATRY AND NEUROLOGY-NEUROLOGY | Admitting: PSYCHIATRY AND NEUROLOGY-NEUROLOGY

## 2021-10-18 ENCOUNTER — Other Ambulatory Visit: Payer: Self-pay

## 2021-10-18 NOTE — PT Treatment (Addendum)
[facility]  Outpatient Physical Therapy  [place], [address]  [phone]  [phone]    Physical Therapy Treatment Note    Date: 10/18/2021  Patient's Name: Bonnie Kline  Date of Birth: [birth year]    PHYSICAL THERAPY DISCHARGE NOTE  Patient attended 12 sessions of PT from 5/10 to 6/23 for cervicalgia. Treatment included MFR, jt mobs and therex with HEP. See note below for last attended sessions assessment findings. ROM is WFL. Patient called and stated she was feeling better and could manage now independently. Patient is discharged from PT services at this time.       Visit #/POC: 12/8-12: 6/28  Authorization: med [employer]      Evaluating Physical Therapist: Zenia Resides, PT  PT diagnosis/Reason for Referral: Cervicalgia  Next Scheduled Physician Appointment: unsure      Subjective: Patient reports although there remains lingering soreness in cervical muscles with 60% improved. Stretch relief from last visit was short lived. Remaining deficit remains with limited cervical rotation while driving.       Objective: treatment delivered as noted below.    Seated cervical AROM: bilat ROT 57 degrees, bilat SB 40 degrees, flexion 55 degrees, extension 55 degrees.     EXERCISE/ACTIVITY NAME REPETITIONS RESISTANCE COMPLETED THIS DOS   Suboccipital release     no   Manual cervical distraction  Manual cervical stretching all directions  Manual scalene stretching  Seated active scalene stretching   No  no    no  no         Manual face clocks   no   Manual anterior cervicalmobs   no   bilat up/down slides  bilat no   Manual face clock  Active face clock     no  no: HEP 09/10/21   Prone thoracic PA mobs    Grade 2&3 no   MFR: UT and levator   --R UT trigger release During manual stretch  seated   manual No  no   Seated U/T mob with cervical AROM   no   Supine thoracic ROT with wand x10 with 2 sec hold  no   MH to neck x10 min Post treatment no    Combo: R UT, supra, levator, Rhomboids, teres, infrap X8 min Concentration UT, supra and levator no   Kinesio tape: inhibitory  --GH mechanical correction (R) I strip x3  I strip x2 R UT, supra, levator No  no   rotational PA mobs to C7- T3 seated  manual no   HEP education  Review only yes   Snag ROT with towel X2 ea side with 10 sec hold  no: HEP 09/26/21   Supine: suboccipital release using miracle ball 5 min  no   Supine static assessment of Buckwheat pillow  Neck rotation on buckwheat pillow   no   Re-assessment of goals and objectives   no   C/s retraction   5 x 2 seconds    no   Seated ER   2 x10  yellow  no: HEP 10/08/21   UBE x5 min  70 rpm no   Supine: horizontal abd    --shoulder extension    --supine D2 flexion x30    x30    x20 Red Tband    Red Tband    Yellow Tband no: HEP 10/11/21  no: HEP 10/11/21  no: HEP 10/11/21   Re-assessment of goals and objectives   yes   Cervical  retraction: seated and supine   Yes: HEP 10/18/21     Patient-Specific Functional Score:    Problem Score                               10/15/21   1. Turning head when driving  5                                          7   2. Getting sleep 7                                          8   3.         Total: 6                                           7.5     Access Code: [email or web address]  URL: [email or web address]  Date: 10/18/2021  Prepared by: Benjie Karvonen    Exercises  - Supine Cervical Retraction with Towel  - 1 x daily - 7 x weekly - 10 reps - 5 hold  - Seated Passive Cervical Retraction  - 1 x daily - 7 x weekly - 10 reps - 5 hold    Assessment:  No change in PSFS since last assessed on 10/15/21. Continues to report need to rotate trunk when driving secondary to lack of cervical ROT. Initiated cervical retraction for strengthening, once she was given TC via hand and wash cloth she was able to perform correctly. AROM primarily remains limited with cervical ROT bilat, but improved. Illustrated handout given adding cervical  retraction supine and seated to HEP.     Short Term Goals: 3 Weeks  -Independent home program. ROM / Stabilization / Self care / Activity modification.    (MET 10/03/21)               -Decrease in pain. Intermittent vs. constant. Worst SPS rating less than 4.   (Pain level 3-4 on 10/03/21, but goal partially met as pain is intermittent-only with rotation)               -Increase ROM c/s by 5-10 degrees / Strength scapular stabilizers   MET 09/19/21                -Improve function. Decreased sleep disruption   MET 09/19/21                -Other compliant with HEP.   (MET 10/03/21)    Long Term Goals: 6 Weeks    -Abolish radicular symptoms. Worst SPS rating less than 2.  Pain level 2-4, Met for radicular symptoms 10/08/21   -Increase ROM / Strength to [facility] to allow normal body mechanics with all mobility. Progressing 10/08/21   -Improve function. Restorative sleep /tolerance of static posture not limited by pain. (MET 10/18/21)               -Other patient specific functional scale > 8 (Progressing 10/03/21)    Plan: See PT note for plan.     Total Session Time 44 and Timed code 35 minutes   THERAPEUTIC  EXERCISE 35 minutes      Benjie Karvonen, PTA  10/18/2021 14:10

## 2021-10-21 ENCOUNTER — Ambulatory Visit (HOSPITAL_COMMUNITY): Payer: Self-pay

## 2021-10-22 ENCOUNTER — Other Ambulatory Visit (HOSPITAL_PSYCHIATRIC): Payer: Self-pay | Admitting: Psychiatry

## 2021-10-22 ENCOUNTER — Telehealth (HOSPITAL_PSYCHIATRIC): Payer: Self-pay | Admitting: Psychiatry

## 2021-10-22 MED ORDER — BUSPIRONE 15 MG TABLET
15.0000 mg | ORAL_TABLET | Freq: Three times a day (TID) | ORAL | 3 refills | Status: DC
Start: 2021-10-22 — End: 2021-12-02

## 2021-10-22 MED ORDER — FLUOXETINE 20 MG CAPSULE
20.0000 mg | ORAL_CAPSULE | Freq: Every day | ORAL | 3 refills | Status: DC
Start: 2021-10-22 — End: 2021-12-02

## 2021-10-22 MED ORDER — FLUOXETINE 40 MG CAPSULE
40.0000 mg | ORAL_CAPSULE | Freq: Every day | ORAL | 3 refills | Status: DC
Start: 2021-10-22 — End: 2021-12-02

## 2021-10-22 NOTE — Telephone Encounter (Signed)
Patient will be going out of town July 21st and will need refills on her prescriptions.  She won't be back until August 1st.  Her follow up appointment is on August 7th.  Please refill her meds before July 21st so she will have them before she leaves.  Thanks

## 2021-10-22 NOTE — Telephone Encounter (Signed)
Done

## 2021-10-23 ENCOUNTER — Ambulatory Visit (HOSPITAL_COMMUNITY): Payer: Self-pay

## 2021-11-26 ENCOUNTER — Ambulatory Visit (HOSPITAL_PSYCHIATRIC): Payer: Self-pay | Admitting: Psychiatry

## 2021-12-02 ENCOUNTER — Encounter (HOSPITAL_PSYCHIATRIC): Payer: Self-pay | Admitting: Psychiatry

## 2021-12-02 ENCOUNTER — Other Ambulatory Visit: Payer: Self-pay

## 2021-12-02 ENCOUNTER — Ambulatory Visit: Payer: Medicare Other | Attending: Psychiatry | Admitting: Psychiatry

## 2021-12-02 VITALS — BP 131/82 | HR 55 | Resp 18 | Ht 66.0 in | Wt 173.0 lb

## 2021-12-02 DIAGNOSIS — F411 Generalized anxiety disorder: Secondary | ICD-10-CM | POA: Insufficient documentation

## 2021-12-02 DIAGNOSIS — F332 Major depressive disorder, recurrent severe without psychotic features: Secondary | ICD-10-CM | POA: Insufficient documentation

## 2021-12-02 MED ORDER — BUSPIRONE 15 MG TABLET
15.0000 mg | ORAL_TABLET | Freq: Three times a day (TID) | ORAL | 1 refills | Status: DC
Start: 2021-12-02 — End: 2022-02-13

## 2021-12-02 MED ORDER — FLUOXETINE 20 MG CAPSULE
20.0000 mg | ORAL_CAPSULE | Freq: Every day | ORAL | 1 refills | Status: DC
Start: 2021-12-02 — End: 2022-02-13

## 2021-12-02 MED ORDER — FLUOXETINE 40 MG CAPSULE
40.0000 mg | ORAL_CAPSULE | Freq: Every day | ORAL | 1 refills | Status: DC
Start: 2021-12-02 — End: 2022-02-13
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
## 2022-01-07 ENCOUNTER — Ambulatory Visit: Payer: Medicare Other | Attending: FAMILY PRACTICE

## 2022-01-07 ENCOUNTER — Other Ambulatory Visit: Payer: Self-pay

## 2022-01-07 DIAGNOSIS — E039 Hypothyroidism, unspecified: Secondary | ICD-10-CM | POA: Insufficient documentation

## 2022-01-07 DIAGNOSIS — R413 Other amnesia: Secondary | ICD-10-CM | POA: Insufficient documentation

## 2022-01-07 DIAGNOSIS — E785 Hyperlipidemia, unspecified: Secondary | ICD-10-CM | POA: Insufficient documentation

## 2022-01-07 DIAGNOSIS — K219 Gastro-esophageal reflux disease without esophagitis: Secondary | ICD-10-CM | POA: Insufficient documentation

## 2022-01-07 DIAGNOSIS — R079 Chest pain, unspecified: Secondary | ICD-10-CM | POA: Insufficient documentation

## 2022-01-07 LAB — LIPID PANEL
CHOL/HDL RATIO: 2.6
CHOLESTEROL: 210 mg/dL — ABNORMAL HIGH (ref <200)
HDL CHOL: 82 mg/dL (ref 23–92)
LDL CALC: 112 mg/dL — ABNORMAL HIGH (ref 0–100)
TRIGLYCERIDES: 82 mg/dL (ref <=150)
VLDL CALC: 16 mg/dL (ref 0–50)

## 2022-01-07 LAB — CBC WITH DIFF
BASOPHIL #: 0 10*3/uL (ref 0.00–0.10)
BASOPHIL %: 0 % (ref 0–1)
EOSINOPHIL #: 0.3 10*3/uL (ref 0.00–0.50)
EOSINOPHIL %: 5 %
HCT: 41.2 % (ref 31.2–41.9)
HGB: 13.6 g/dL (ref 10.9–14.3)
LYMPHOCYTE #: 1.6 10*3/uL (ref 1.00–3.00)
LYMPHOCYTE %: 32 % (ref 16–44)
MCH: 28.9 pg (ref 24.7–32.8)
MCHC: 33 g/dL (ref 32.3–35.6)
MCV: 87.5 fL (ref 75.5–95.3)
MONOCYTE #: 0.4 10*3/uL (ref 0.30–1.00)
MONOCYTE %: 7 % (ref 5–13)
MPV: 10.3 fL (ref 7.9–10.8)
NEUTROPHIL #: 2.8 10*3/uL (ref 1.85–7.80)
NEUTROPHIL %: 55 % (ref 43–77)
PLATELETS: 213 10*3/uL (ref 140–440)
RBC: 4.7 10*6/uL (ref 3.63–4.92)
RDW: 14.3 % (ref 12.3–17.7)
WBC: 5.1 10*3/uL (ref 3.8–11.8)

## 2022-01-07 LAB — BASIC METABOLIC PANEL
ANION GAP: 3 mmol/L — ABNORMAL LOW (ref 4–13)
BUN/CREA RATIO: 16 (ref 6–22)
BUN: 13 mg/dL (ref 7–25)
CALCIUM: 11.2 mg/dL — ABNORMAL HIGH (ref 8.6–10.3)
CHLORIDE: 108 mmol/L — ABNORMAL HIGH (ref 98–107)
CO2 TOTAL: 29 mmol/L (ref 21–31)
CREATININE: 0.81 mg/dL (ref 0.60–1.30)
ESTIMATED GFR: 77 mL/min/{1.73_m2} (ref >59)
GLUCOSE: 81 mg/dL (ref 74–109)
OSMOLALITY, CALCULATED: 279 mOsm/kg (ref 270–290)
POTASSIUM: 4.2 mmol/L (ref 3.5–5.1)
SODIUM: 140 mmol/L (ref 136–145)

## 2022-01-07 LAB — HEPATIC FUNCTION PANEL
ALBUMIN/GLOBULIN RATIO: 1.4 (ref 0.8–1.4)
ALBUMIN: 4.1 g/dL (ref 3.5–5.7)
ALKALINE PHOSPHATASE: 101 U/L (ref 34–104)
ALT (SGPT): 11 U/L (ref 7–52)
AST (SGOT): 15 U/L (ref 13–39)
BILIRUBIN DIRECT: 0.13 md/dL (ref <=0.20)
BILIRUBIN TOTAL: 0.6 mg/dL (ref 0.3–1.2)
BILIRUBIN, INDIRECT: 0.47 mg/dL (ref <=1)
GLOBULIN: 3 (ref 2.9–5.4)
PROTEIN TOTAL: 7.1 g/dL (ref 6.4–8.9)

## 2022-01-07 LAB — THYROID STIMULATING HORMONE (SENSITIVE TSH): TSH: 1.651 u[IU]/mL (ref 0.450–5.330)

## 2022-01-07 LAB — MAGNESIUM: MAGNESIUM: 2 mg/dL (ref 1.9–2.7)

## 2022-01-07 LAB — TROPONIN-I: TROPONIN I: 5 ng/L (ref <15)

## 2022-01-09 ENCOUNTER — Encounter (INDEPENDENT_AMBULATORY_CARE_PROVIDER_SITE_OTHER): Payer: Self-pay | Admitting: Surgery

## 2022-01-10 ENCOUNTER — Other Ambulatory Visit: Payer: Self-pay

## 2022-01-13 ENCOUNTER — Encounter (INDEPENDENT_AMBULATORY_CARE_PROVIDER_SITE_OTHER): Payer: Self-pay | Admitting: Surgery

## 2022-01-13 ENCOUNTER — Other Ambulatory Visit: Payer: Self-pay

## 2022-01-13 ENCOUNTER — Ambulatory Visit (INDEPENDENT_AMBULATORY_CARE_PROVIDER_SITE_OTHER): Payer: Medicare Other | Admitting: Surgery

## 2022-01-13 VITALS — BP 123/73 | HR 61 | Temp 97.4°F | Ht 66.0 in | Wt 171.0 lb

## 2022-01-13 DIAGNOSIS — R1310 Dysphagia, unspecified: Secondary | ICD-10-CM

## 2022-01-15 ENCOUNTER — Encounter (INDEPENDENT_AMBULATORY_CARE_PROVIDER_SITE_OTHER): Payer: Self-pay | Admitting: Surgery

## 2022-01-15 NOTE — H&P (Signed)
GENERAL SURGERY, [facility] SURGERY  [address]   [place] [address]    History and Physical     Name: Bonnie Kline MRN:  [id number]   Date: 01/13/2022 Age: 71 y.o.                Reason for Visit: Dysphagia and Choking    History of Present Illness  Ms. Kok presents today with several episodes of cervical dysphagia.  One episode recently required her to regurgitate the food and caused her more distress secondary to this.  Seems to be solids and not liquids.  Occasionally has a choking sensation associated with this.  Has some underlying gastroesophageal reflux symptoms.  Takes Protonix for this and controls most of her reflux symptoms.  Has minimal breakthrough symptoms with it.  No prior EGD.  No known history of peptic ulcer disease.        MEDICAL DECISION:    Review of prior external note(s) from each unique source:  Patients referral to this office including a recent assessment by the referring provider.  This was reviewed by me for this unique office visit for the indication and intent of the referral as well as any pertinent medical or surgical history relevant to the patients independent evaluation by me today.        Patient Data  Patient History  Past Medical History:   Diagnosis Date    Asymptomatic varicose veins     B12 deficiency     Bilateral carotid bruits     Hiatal hernia     Hyperparathyroidism (CMS HCC)     Hypertension     Renal cyst     RLS (restless legs syndrome)     Sleep apnea     SVT (supraventricular tachycardia) (CMS HCC)     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)     Unspecified urinary incontinence     Vitamin D deficiency          Past Surgical History:   Procedure Laterality Date    COLONOSCOPY      ESOPHAGOGASTRODUODENOSCOPY      HX CARDIAC ABLATION      HX COLON SURGERY (ANY)      ORTHOPEDIC SURGERY      right ankle         Current Outpatient Medications   Medication Sig    aspirin (ECOTRIN) 81 mg Oral Tablet, Delayed Release (E.C.) Take 1 Tablet (81  mg total) by mouth Once a day    busPIRone (BUSPAR) 15 mg Oral Tablet Take 1 Tablet (15 mg total) by mouth Three times a day    diosmin complex no.1 (VASCULERA) 630 mg Oral Tablet Take 1 Tablet (630 mg total) by mouth Once a day    donepeziL (ARICEPT) 5 mg Oral Tablet Take 1 Tablet (5 mg total) by mouth Once a day    ergocalciferol, vitamin D2, (DRISDOL) 1,250 mcg (50,000 unit) Oral Capsule Take 1 Capsule (50,000 Units total) by mouth Every 7 days (Patient not taking: Reported on 01/13/2022)    ferrous sulfate (FERATAB) 324 mg (65 mg iron) Oral Tablet, Delayed Release (E.C.) Take 1 Tablet (324 mg total) by mouth Once a day    FLUoxetine (PROZAC) 20 mg Oral Capsule Take 1 Capsule (20 mg total) by mouth Once a day Take with 40 mg pill for total dose of 60 mg every morning    FLUoxetine (PROZAC) 40 mg Oral Capsule Take 1 Capsule (40 mg total) by mouth Once a day  latanoprost (XALATAN) 0.005 % Ophthalmic Drops Instill 1 Drop into both eyes Once a day    Magnesium Glycinate 100 mg Oral Tablet Take 4 Tablets (400 mg total) by mouth Once a day    multivitamin-minerals-lutein (MULTIVITAMIN 50 PLUS) Oral Tablet Take 1 Tablet by mouth Once a day    pantoprazole (PROTONIX) 40 mg Oral Tablet, Delayed Release (E.C.) Take 1 Tablet (40 mg total) by mouth Once a day    pravastatin (PRAVACHOL) 20 mg Oral Tablet Take 1 Tablet (20 mg total) by mouth Once a day    solifenacin (VESICARE) 10 mg Oral Tablet Take 1 Tablet (10 mg total) by mouth Once a day    timoloL maleate (TIMOPTIC) 0.25 % Ophthalmic Drops Instill 1 Drop into both eyes Every evening     Allergies   Allergen Reactions    Amoxicillin Rash    Clavulanic Acid Rash    Penicillins Rash     Family Medical History:    None         Social History     Tobacco Use    Smoking status: Former     Types: Cigarettes    Smokeless tobacco: Never   Vaping Use    Vaping Use: Never used   Substance Use Topics    Alcohol use: Never    Drug use: Never            Physical Examination:  Vitals:     01/13/22 1419   BP: 123/73   Pulse: 61   Temp: 36.3 C (97.4 F)   SpO2: 93%   Weight: 77.6 kg (171 lb)   Height: 1.676 m (5\' 6" )   BMI: 27.66      General: appropriate for age. in no acute distress.    Vital signs are present above and have been reviewed by me     HEENT: Atraumatic, Normocephalic.    Lungs: Nonlabored breathing with symmetric expansion    Heart:Regular wth respect to rate and rythmn.    Abdomen:Soft. Nontender. Nondistended     Psychiatric: Alert and oriented to person, place, and time. affect appropriate      Assessment and Plan    ICD-10-CM    1. Dysphagia, unspecified type  R13.10         Discussed indications, risks, and benefits of upper endoscopy with the patient including but not limited to the possibility of polypectomy/biopsies, possible repeat examinations, bleeding, sedation risks including cardiac arrhythmias, possibility of missed diagnosis of polyp or malignancy, and remote possibilities of perforation (which may or may not require operative intervention) and death.    All questions were answered, and informed consent was clearly obtained.         I appreciate the opportunity to be involved in the care of your patients.  If you have any questions or concerns regarding this encounter, please do not hesitate to contact me at your convenience.      Bridgett Larsson MD MBA CPE FACS     This note may have been partially generated using MModal Fluency Direct system, and there may be some incorrect words, spellings, and punctuation that were not noted in checking the note before saving, though effort was made to avoid such errors.

## 2022-02-10 IMAGING — MG 3D SCREENING MAMMO BIL AND TOMO
5 series · 7 of 24 positions shown · non-contrast
Comparison: Mammograms dated 06/10/2023, 06/09/2022.
COMPARISON: Mammograms dated 06/10/2023, 06/09/2022.

------------- REPORT [id number] -------------
﻿

EXAM:  3D SCREENING MAMMO BIL AND TOMO
INDICATION: Asymptomatic 71-year-old with family history in her sister. Lifetime risk cancer risk 9.2%.
INDICATION: Asymptomatic 71-year-old with family history in her sister. Lifetime breast cancer risk 9.2%.

[L]
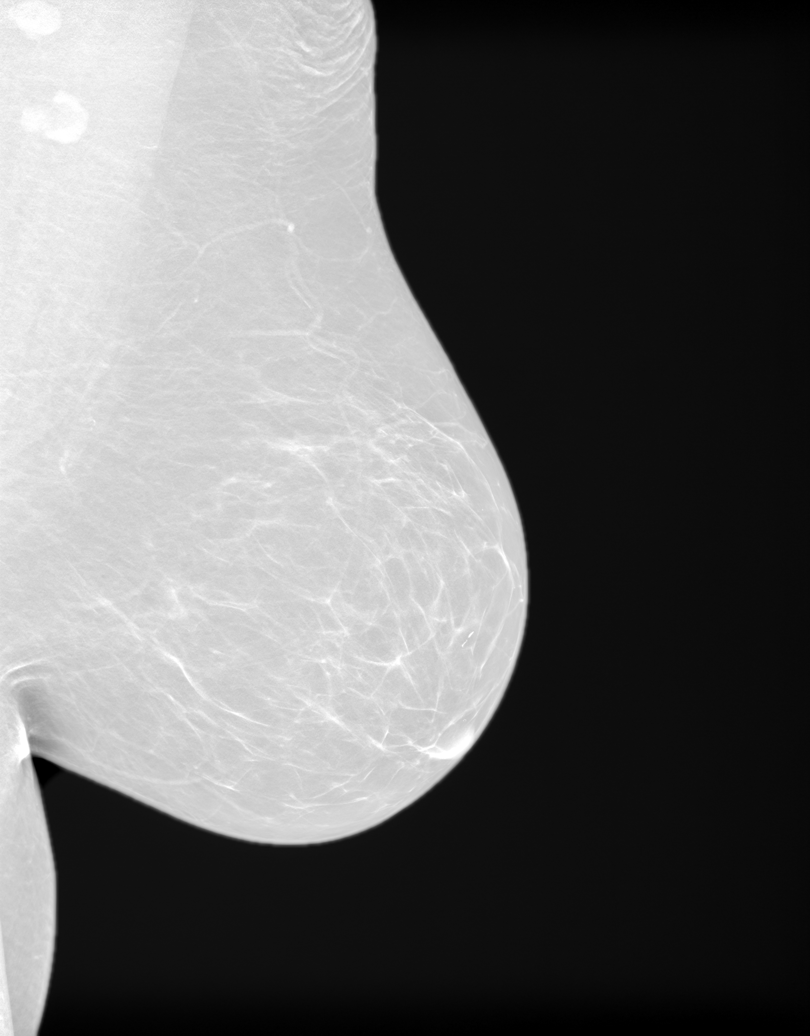

[R]
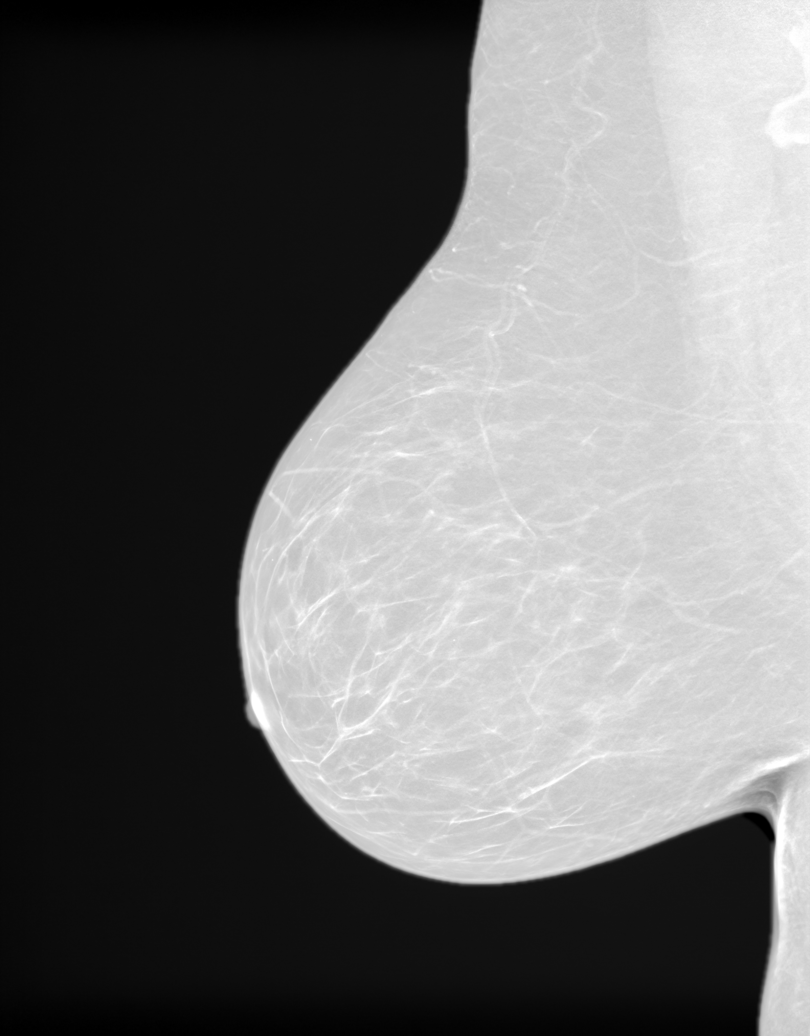

[R CC tomo · right · 0.10mm/px · 2 of 2 slices shown]
[im 1/2]
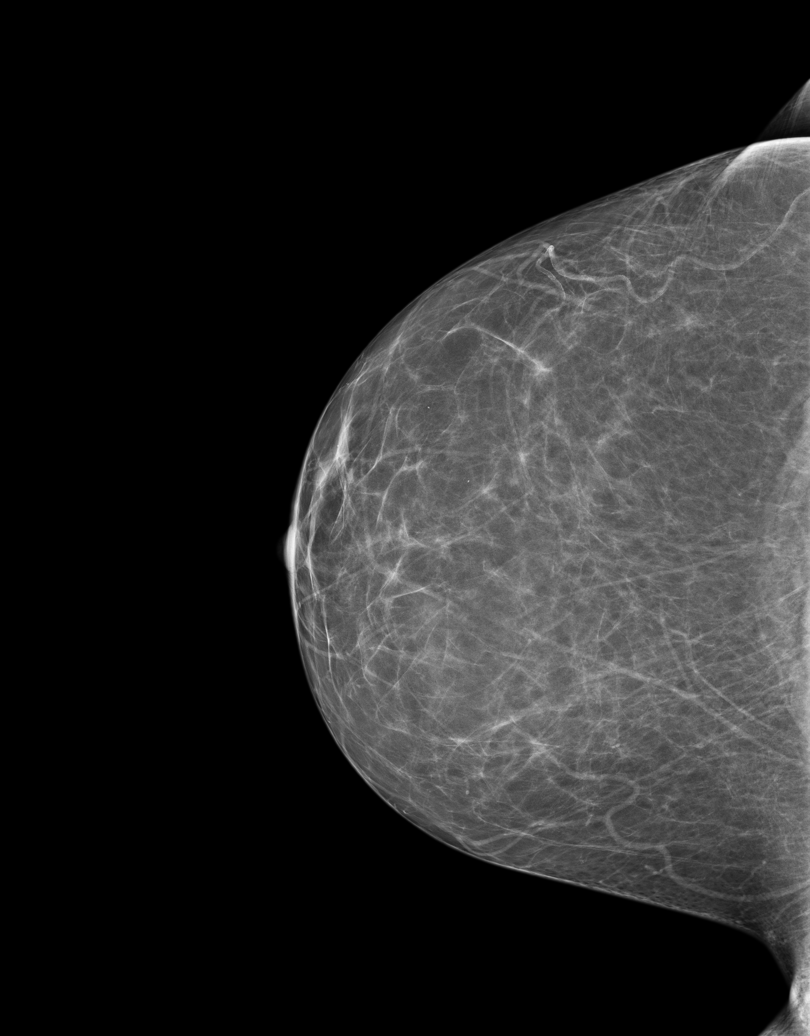
[im 2/2]
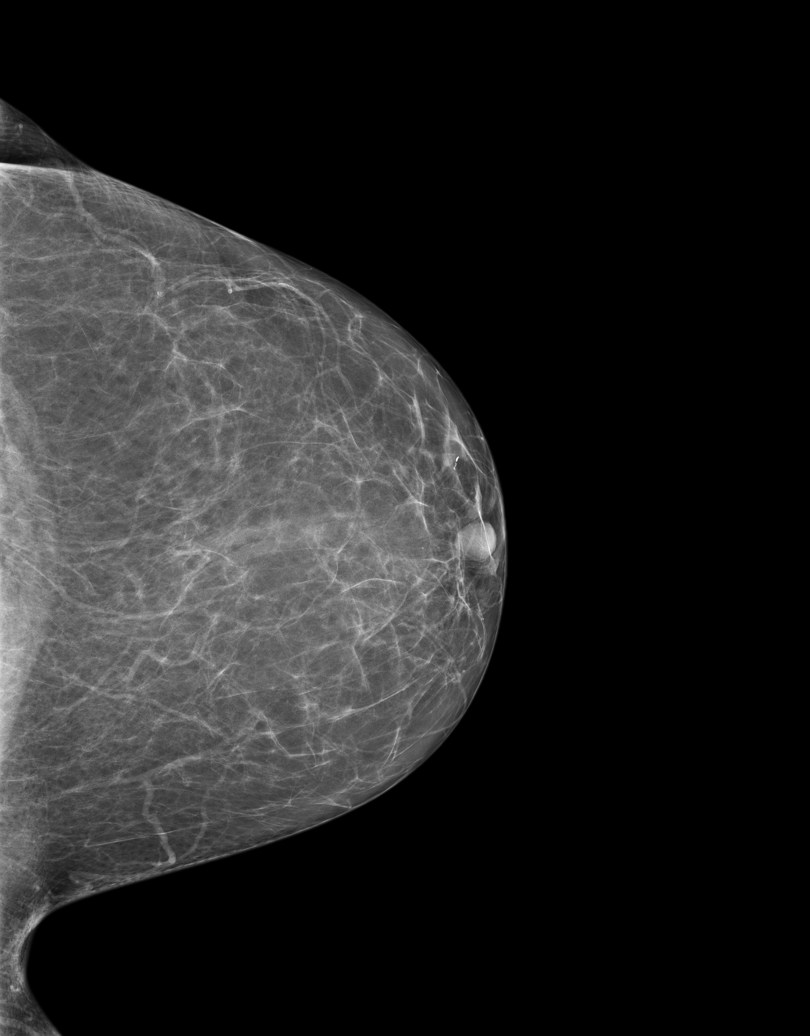

[3D SCREENING MAMMO BIL AND TOMO tomo · 2 acquisitions, 2 frames shown (1 of 2)]
[im 1/2]
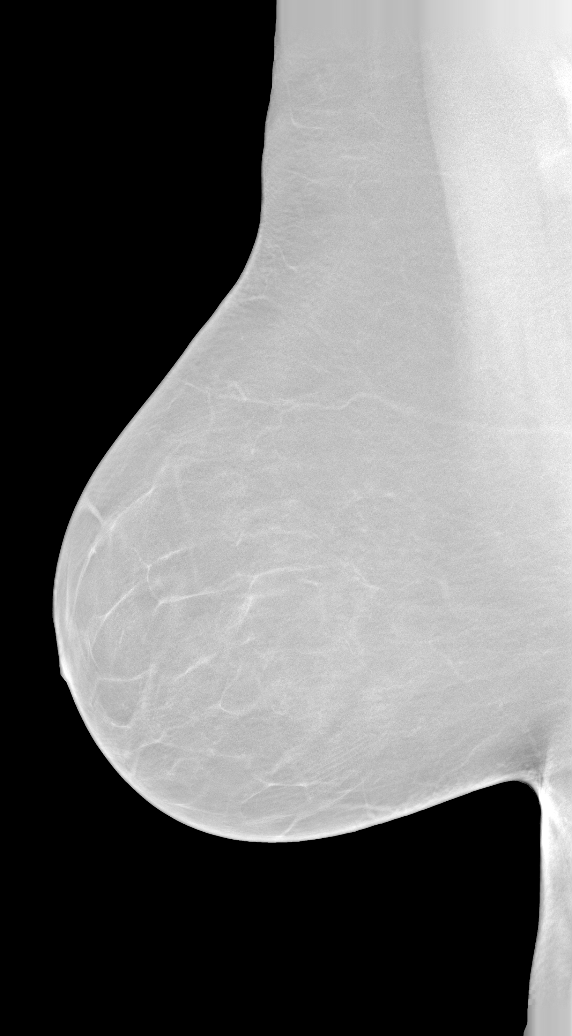
[im 2/2]
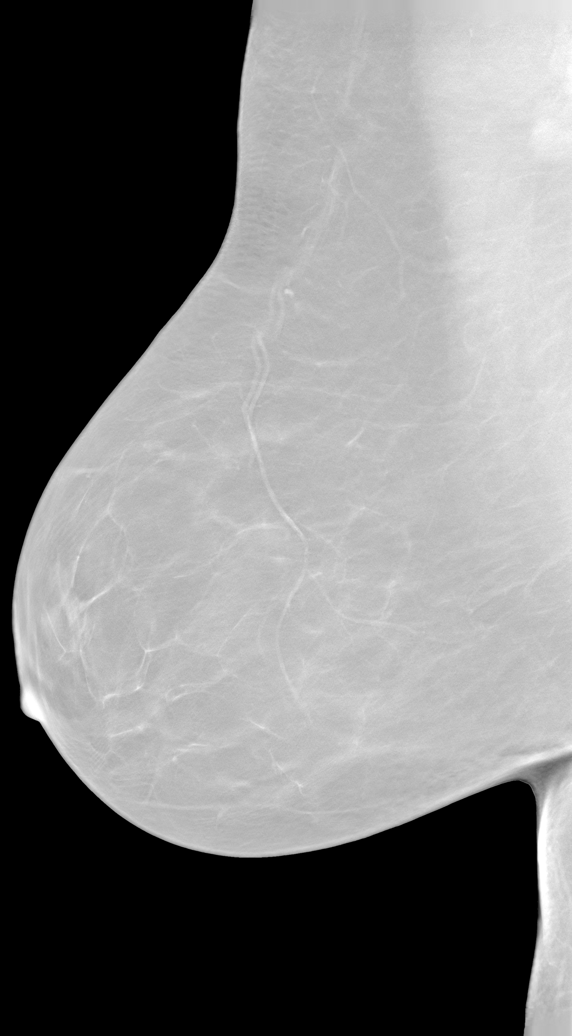

[3D SCREENING MAMMO BIL AND TOMO tomo (2 of 2) · tomo slice 13/78.0]
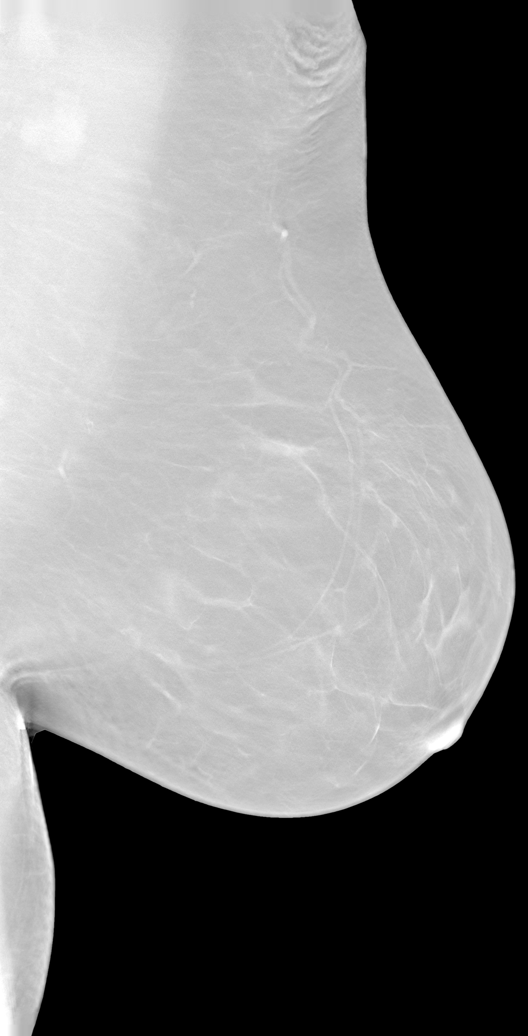

[7 of 24 positions shown; findings below may reference images not displayed]

FINDINGS: No new mass or architectural change are noted.  No abnormal calcific densities skin change nipple change or duct dilation are seen.  Benign lymph nodes in the axilla with fatty hilum are stable.
IMPRESSION: 1.  BIRADS 2-Benign findings. Patient has been added in a reminder system with a target date for the next screening mammography.

2.  DENSITY CODE –  B

Final Assessment Code:

BI-RADS 0
 Need additional imaging evaluation.

BI-RADS 1
 Negative mammogram.

BI-RADS 2
 Benign finding.

BI-RADS 3
 Probably benign finding; short-interval follow-up suggested.

BI-RADS 4
 Suspicious abnormality; biopsy should be considered.

BI-RADS 5
 Highly suggestive of malignancy; appropriate action should be taken.

BI-RADS 6
 Known biopsy-proven malignancy; appropriate action should be taken.

NOTE:
In compliance with Federal regulations, the results of this mammogram are being sent to the patient.

------------- REPORT [id number] -------------
[facility]
[address]
We wish to report the following on your recent mammography examination. We are sending a report to your referring physician or other health care provider.
FINDING: Normal-no evidence of cancer

This statement is mandated by the [place], Department of Health.
Your examination was performed by one of our technologists, who are registered radiological technologists and also specially certified in mammography:
___
[place], [place] (M)

Your mammogram was interpreted by our radiologist.

( 
Amore Pho, M.D.

(Annual Breast Examination by a physician or other health care provider
(Annual Mammography Screening beginning at age 40
(Monthly Breast Self Examination

------------- REPORT [id number] -------------
﻿EXAM:  3D SCREENING MAMMO BIL AND TOMO
FINDINGS: No new mass or architectural changes are noted.  No abnormal calcific densities, skin change, nipple change or duct dilation are seen.  Benign lymph nodes in the axillae with fatty hila are stable.
IMPRESSION: 1.  BIRADS 2-Benign findings. Patient has been added in a reminder system with a target date for the next screening mammography.

2.  DENSITY CODE –  B (Scattered areas of fibroglandular density) 

Final Assessment Code:

BI-RADS 0
 Need additional imaging evaluation.

BI-RADS 1
 Negative mammogram.

BI-RADS 2
 Benign finding.

BI-RADS 3
 Probably benign finding; short-interval follow-up suggested.

BI-RADS 4
 Suspicious abnormality; biopsy should be considered.

BI-RADS 5
 Highly suggestive of malignancy; appropriate action should be taken.

BI-RADS 6
 Known biopsy-proven malignancy; appropriate action should be taken.

NOTE:
In compliance with Federal regulations, the results of this mammogram are being sent to the patient.

## 2022-02-13 ENCOUNTER — Other Ambulatory Visit: Payer: Self-pay

## 2022-02-13 ENCOUNTER — Encounter (HOSPITAL_PSYCHIATRIC): Payer: Self-pay | Admitting: Psychiatry

## 2022-02-13 ENCOUNTER — Ambulatory Visit: Payer: Medicare Other | Attending: Psychiatry | Admitting: Psychiatry

## 2022-02-13 VITALS — BP 123/74 | HR 57 | Resp 18 | Ht 66.0 in | Wt 171.0 lb

## 2022-02-13 DIAGNOSIS — F411 Generalized anxiety disorder: Secondary | ICD-10-CM

## 2022-02-13 DIAGNOSIS — F332 Major depressive disorder, recurrent severe without psychotic features: Secondary | ICD-10-CM | POA: Insufficient documentation

## 2022-02-13 MED ORDER — FLUOXETINE 20 MG CAPSULE
20.0000 mg | ORAL_CAPSULE | Freq: Every day | ORAL | 1 refills | Status: DC
Start: 2022-02-13 — End: 2022-09-18

## 2022-02-13 MED ORDER — FLUOXETINE 40 MG CAPSULE
40.0000 mg | ORAL_CAPSULE | Freq: Every day | ORAL | 1 refills | Status: DC
Start: 2022-02-13 — End: 2022-09-18

## 2022-02-13 MED ORDER — BUSPIRONE 15 MG TABLET
15.0000 mg | ORAL_TABLET | Freq: Three times a day (TID) | ORAL | 1 refills | Status: DC
Start: 2022-02-13 — End: 2022-09-18
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
## 2022-02-25 IMAGING — US CAROTID US W/DOPPLER
1 series · 14 of 24 positions shown · non-contrast
Comparison: None available.

﻿EXAM:  CAROTID US W/DOPPLER
INDICATION: Hypercalcemia.

[Series 1: carotid us w/doppler · 14 of 31 slices shown]
[im 1/31]
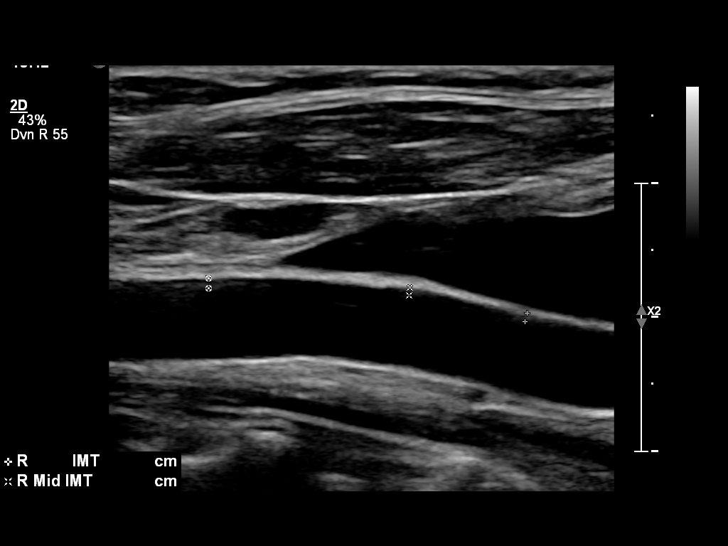
[im 3/31]
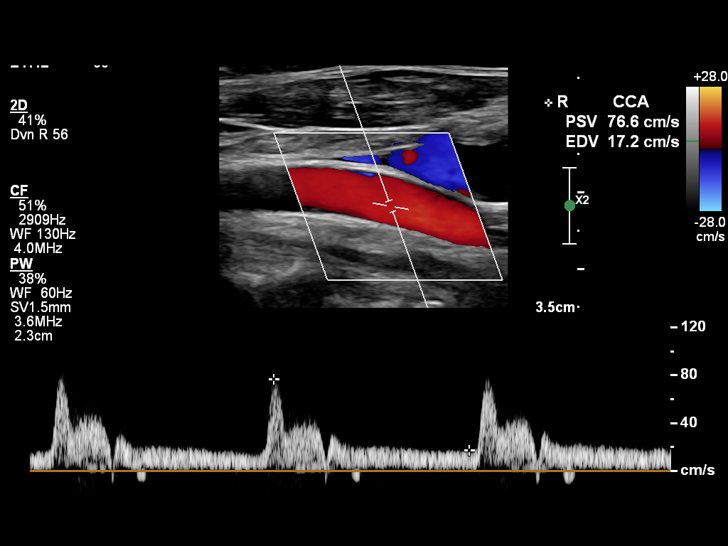
[im 6/31]
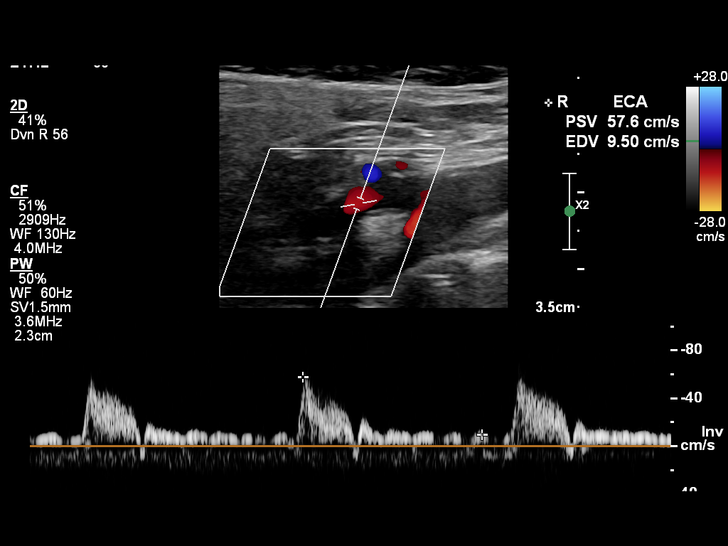
[im 8/31]
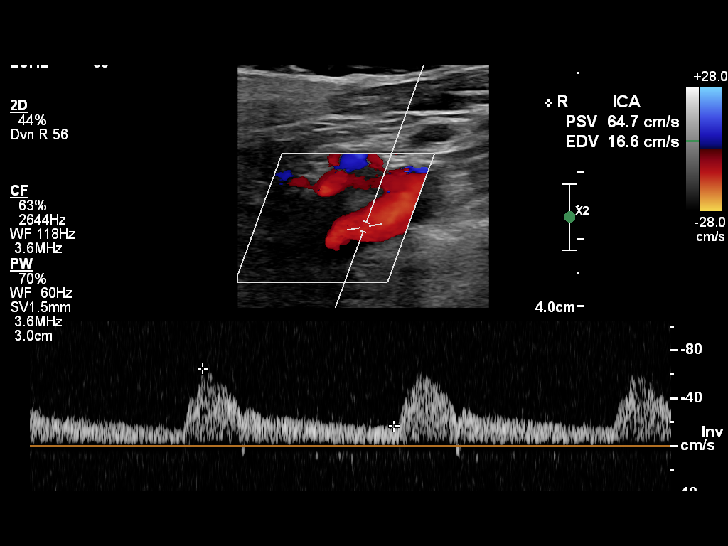
[im 10/31]
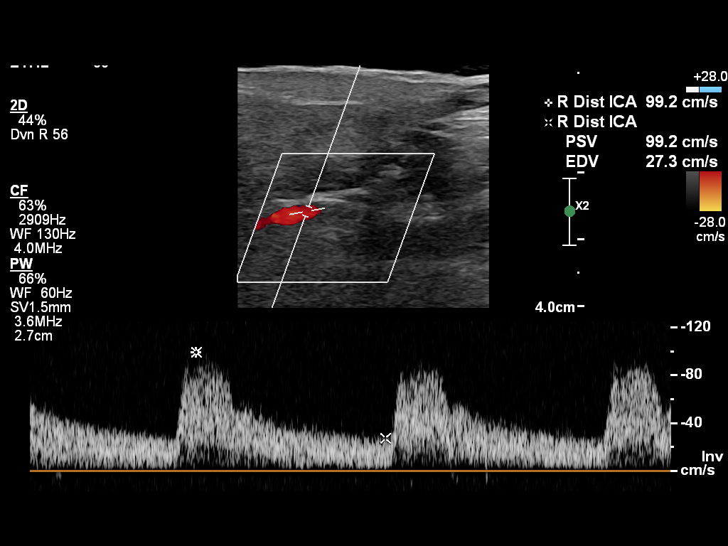
[im 12/31]
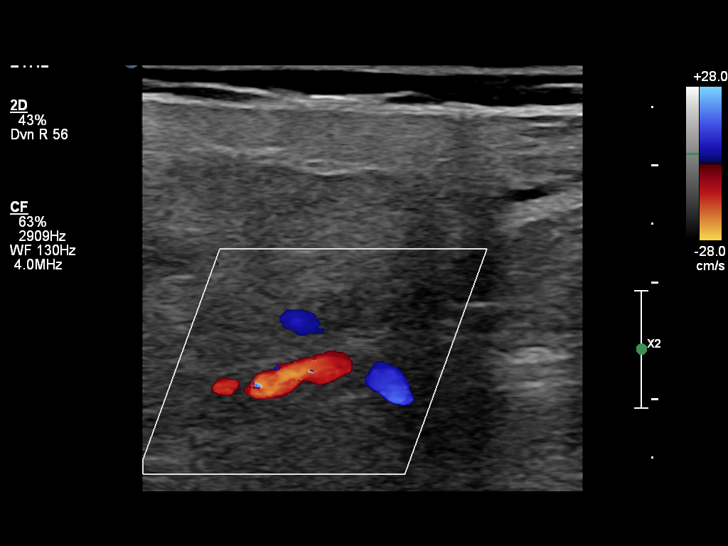
[im 15/31]
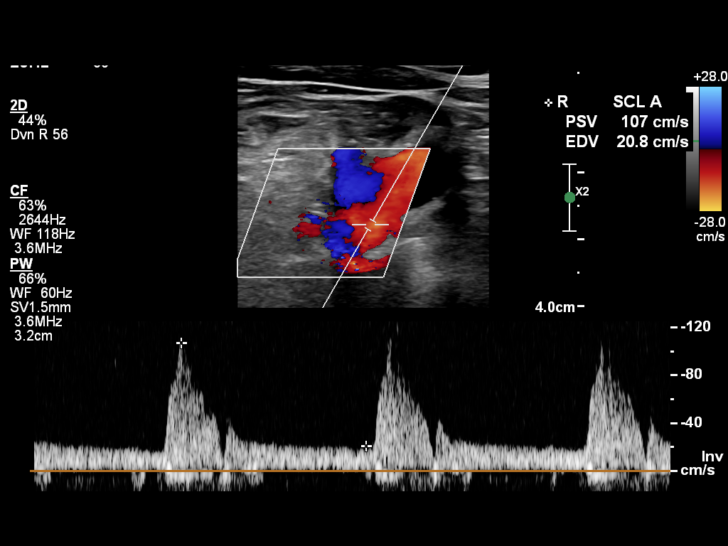
[im 16/31]
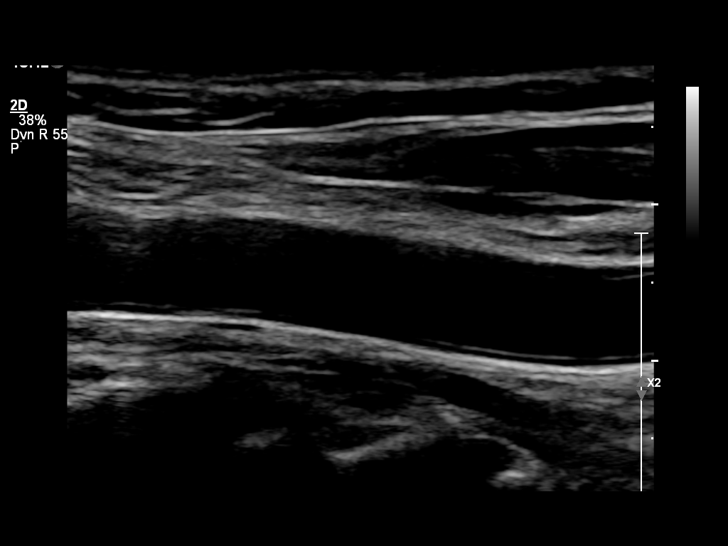
[im 19/31]
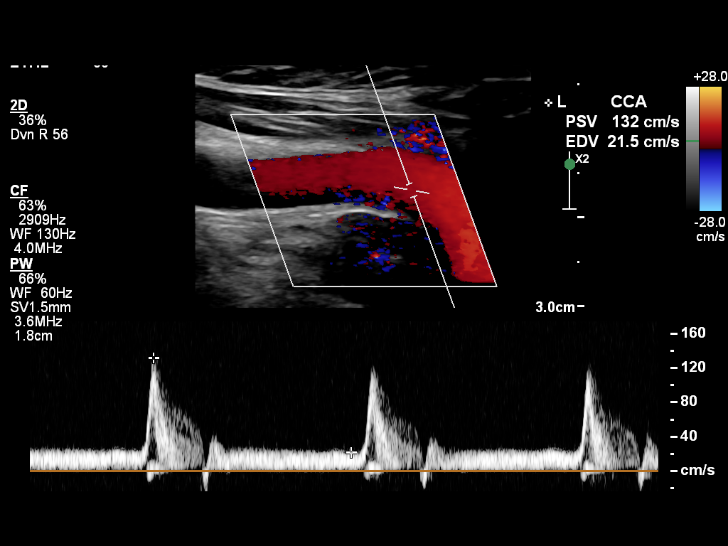
[im 21/31]
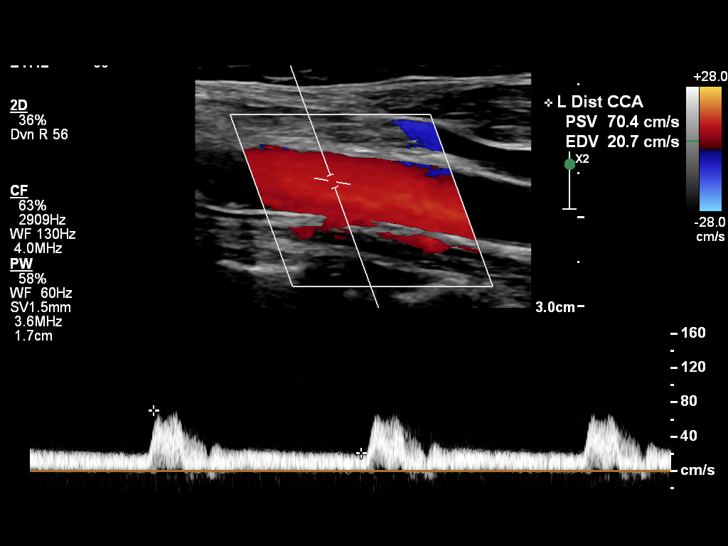
[im 24/31]
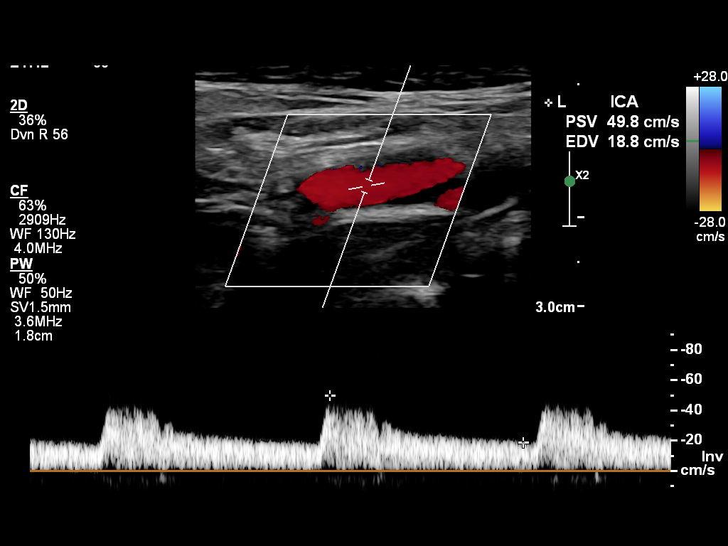
[im 25/31]
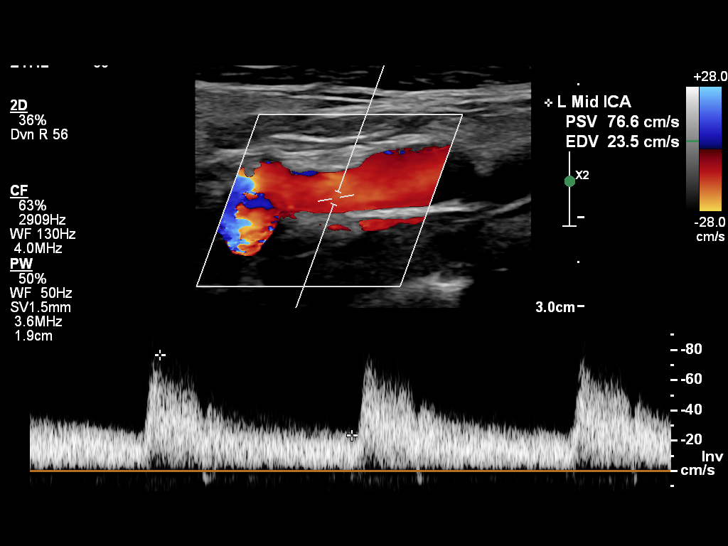
[im 28/31]
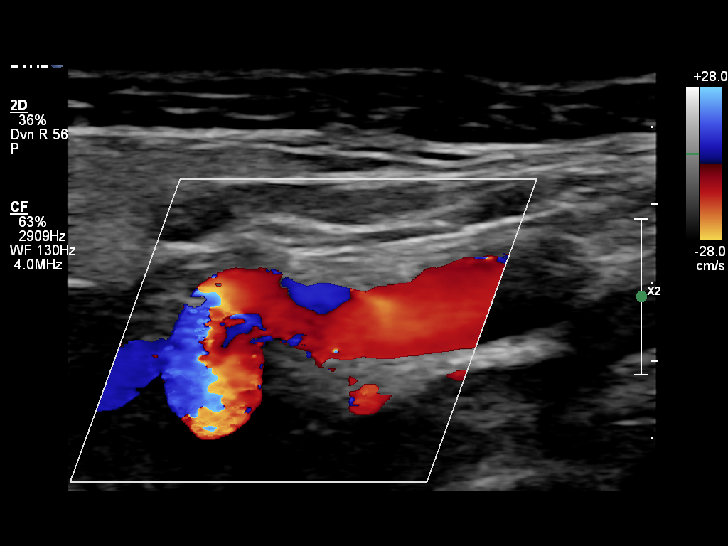
[im 31/31]
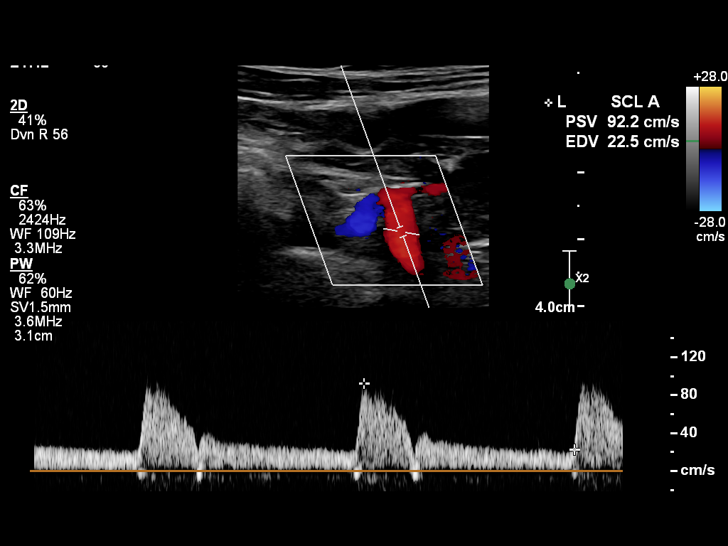

[14 of 24 positions shown; findings below may reference images not displayed]

FINDINGS: Right CCA

76

Left CCA

132

PSV

IC/CC

EDV

Right Internal

112

1.4

34

Left Internal

162

1.02

38

Right Vertebral

Antegrade

Left Vertebral

Antegrade

Examination of the carotid arteries reveals a moderate amount of heterogeneous plaque in the region of the carotid bifurcations bilaterally.  However, there is no significant flow velocity elevation or stenosis seen on either side. 

Antegrade flow is noted within the vertebral arteries bilaterally.
IMPRESSION: 1. Moderate atherosclerotic disease. 

2. No significant carotid stenosis.

## 2022-05-08 ENCOUNTER — Ambulatory Visit (HOSPITAL_COMMUNITY): Payer: Medicare Other | Admitting: Surgery

## 2022-05-08 ENCOUNTER — Ambulatory Visit (HOSPITAL_COMMUNITY): Payer: Medicare Other | Admitting: Certified Registered"

## 2022-05-08 ENCOUNTER — Ambulatory Visit
Admission: RE | Admit: 2022-05-08 | Discharge: 2022-05-08 | Disposition: A | Discharge status: Home / Self Care | Payer: Medicare Other | Source: Ambulatory Visit | Attending: Surgery | Admitting: Surgery

## 2022-05-08 ENCOUNTER — Other Ambulatory Visit: Payer: Self-pay

## 2022-05-08 ENCOUNTER — Encounter (HOSPITAL_COMMUNITY)
Admission: RE | Disposition: A | Discharge status: Home / Self Care | Payer: Self-pay | Source: Ambulatory Visit | Attending: Surgery

## 2022-05-08 ENCOUNTER — Encounter (HOSPITAL_COMMUNITY): Payer: Self-pay | Admitting: Surgery

## 2022-05-08 DIAGNOSIS — G2581 Restless legs syndrome: Secondary | ICD-10-CM | POA: Insufficient documentation

## 2022-05-08 DIAGNOSIS — I10 Essential (primary) hypertension: Secondary | ICD-10-CM | POA: Insufficient documentation

## 2022-05-08 DIAGNOSIS — K31A0 Gastric intestinal metaplasia, unspecified: Secondary | ICD-10-CM | POA: Insufficient documentation

## 2022-05-08 DIAGNOSIS — Z9989 Dependence on other enabling machines and devices: Secondary | ICD-10-CM | POA: Insufficient documentation

## 2022-05-08 DIAGNOSIS — Z87891 Personal history of nicotine dependence: Secondary | ICD-10-CM | POA: Insufficient documentation

## 2022-05-08 DIAGNOSIS — R1319 Other dysphagia: Secondary | ICD-10-CM | POA: Insufficient documentation

## 2022-05-08 DIAGNOSIS — G4730 Sleep apnea, unspecified: Secondary | ICD-10-CM | POA: Insufficient documentation

## 2022-05-08 DIAGNOSIS — K2950 Unspecified chronic gastritis without bleeding: Secondary | ICD-10-CM | POA: Insufficient documentation

## 2022-05-08 DIAGNOSIS — K317 Polyp of stomach and duodenum: Secondary | ICD-10-CM | POA: Insufficient documentation

## 2022-05-08 DIAGNOSIS — K219 Gastro-esophageal reflux disease without esophagitis: Secondary | ICD-10-CM | POA: Insufficient documentation

## 2022-05-08 DIAGNOSIS — K449 Diaphragmatic hernia without obstruction or gangrene: Secondary | ICD-10-CM | POA: Insufficient documentation

## 2022-05-08 SURGERY — GASTROSCOPY WITH BIOPSY
Anesthesia: General | Wound class: Clean Contaminated Wounds-The respiratory, GI, Genital, or urinary

## 2022-05-08 MED ORDER — LIDOCAINE (PF) 100 MG/5 ML (2 %) INTRAVENOUS SYRINGE
INJECTION | Freq: Once | INTRAVENOUS | Status: DC | PRN
Start: 2022-05-08 — End: 2022-05-08
  Administered 2022-05-08: 100 mg via INTRAVENOUS

## 2022-05-08 MED ORDER — PROPOFOL 10 MG/ML INTRAVENOUS EMULSION
Freq: Once | INTRAVENOUS | Status: DC | PRN
Start: 2022-05-08 — End: 2022-05-08
  Administered 2022-05-08: 5 mL via INTRAVENOUS

## 2022-05-08 MED ORDER — SODIUM CHLORIDE 0.9 % INTRAVENOUS SOLUTION
INTRAVENOUS | Status: DC | PRN
Start: 2022-05-08 — End: 2022-05-08

## 2022-05-08 SURGICAL SUPPLY — 1 items
FORCEPS BIOPSY MICROMESH TTH STREAMLINE CATH NEEDLE 240CM 2.4MM RJ 4 SS LRG CPC STRL DISP ORNG 2.8MM (ENDOSCOPIC SUPPLIES) ×1 IMPLANT

## 2022-05-08 NOTE — Anesthesia Preprocedure Evaluation (Signed)
ANESTHESIA PRE-OP EVALUATION  Planned Procedure: EGD WITH BIOPSY  Review of Systems     anesthesia history negative               Pulmonary   sleep apnea, CPAP and past history of smoking ,   Cardiovascular    Hypertension and S/P ablation for SVT ,No peripheral edema,  Exercise Tolerance: > or = 4 METS        GI/Hepatic/Renal    hiatal hernia        Endo/Other         Neuro/Psych/MS    RLS, depression     Cancer    negative hematology/oncology ROS,                     Physical Assessment      Airway       Mallampati: III    TM distance: >3 FB    Neck ROM: full  Mouth Opening: good.            Dental       Dentition intact             Pulmonary    Breath sounds clear to auscultation  (-) no rhonchi, no decreased breath sounds, no wheezes, no rales and no stridor     Cardiovascular    Rhythm: regular  Rate: Normal  (-) no friction rub, carotid bruit is not present, no peripheral edema and no murmur     Other findings              Plan  ASA 3     Planned anesthesia type: general     total intravenous anesthesia                          Anesthetic plan and risks discussed with patient  signed consent obtained          Patient's NPO status is appropriate for Anesthesia.

## 2022-05-08 NOTE — H&P (Signed)
[address]   [place] [address]    History and Physical     Name: JEANIE MCCARD MRN:  [id number]   Date: 01/21/2022 Age: 72 y.o.                Reason for Visit: No chief complaint on file.    History of Present Illness  Ms. Mordan presents today with several episodes of cervical dysphagia.  One episode recently required her to regurgitate the food and caused her more distress secondary to this.  Seems to be solids and not liquids.  Occasionally has a choking sensation associated with this.  Has some underlying gastroesophageal reflux symptoms.  Takes Protonix for this and controls most of her reflux symptoms.  Has minimal breakthrough symptoms with it.  No prior EGD.  No known history of peptic ulcer disease.            Patient Data  Patient History  Past Medical History:   Diagnosis Date    Asymptomatic varicose veins     B12 deficiency     Bilateral carotid bruits     Hiatal hernia     Hyperparathyroidism (CMS HCC)     Hypertension     Renal cyst     RLS (restless legs syndrome)     Sleep apnea     SVT (supraventricular tachycardia)     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)     Unspecified urinary incontinence     Vitamin D deficiency          Past Surgical History:   Procedure Laterality Date    COLONOSCOPY      ESOPHAGOGASTRODUODENOSCOPY      HX CARDIAC ABLATION      HX COLON SURGERY (ANY)      ORTHOPEDIC SURGERY      right ankle         No current outpatient medications on file.     Allergies   Allergen Reactions    Amoxicillin Rash    Clavulanic Acid Rash    Penicillins Rash     Family Medical History:    None         Social History     Tobacco Use    Smoking status: Former     Types: Cigarettes     Passive exposure: Never    Smokeless tobacco: Never   Vaping Use    Vaping Use: Never used   Substance Use Topics    Alcohol use: Never    Drug use: Never            Physical Examination:  Vitals:    05/08/22 0735   BP: 115/61   Pulse: 53   Resp: 20   Temp: 37.1  C (98.8 F)   SpO2: 97%   Weight: 78 kg (172 lb)   Height: 1.689 m (5' 6.5")   BMI: 27.4      General: appropriate for age. in no acute distress.    Vital signs are present above and have been reviewed by me     HEENT: Atraumatic, Normocephalic.    Lungs: Nonlabored breathing with symmetric expansion    Heart:Regular wth respect to rate and rythmn.    Abdomen:Soft. Nontender. Nondistended     Psychiatric: Alert and oriented to person, place, and time. affect appropriate        Discussed indications, risks, and benefits of upper endoscopy with the patient including but not limited to the possibility of polypectomy/biopsies,  possible repeat examinations, bleeding, sedation risks including cardiac arrhythmias, possibility of missed diagnosis of polyp or malignancy, and remote possibilities of perforation (which may or may not require operative intervention) and death.    All questions were answered, and informed consent was clearly obtained.       Bridgett Larsson MD MBA CPE FACS     This note may have been partially generated using MModal Fluency Direct system, and there may be some incorrect words, spellings, and punctuation that were not noted in checking the note before saving, though effort was made to avoid such errors.

## 2022-05-08 NOTE — OR Surgeon (Signed)
[facility]    Patient Name: Bonnie Kline, Bonnie Kline Number: [id number]  Date of Service: 05/08/2022   Date of Birth: [birth year]      Pre-Operative Diagnosis: DYSPHAGIA     Post-Operative Diagnosis: Gastritis  Hyperplastic appearing gastric polyps  3cm Hiatal Hernia    Procedure(s)/Description:  EGD WITH BIOPSY: 43239 (CPT)     Attending Surgeon: Shirlee More, MD     Anesthesia:  CRNA: Carmina Miller, CRNA    Anesthesia Type: .General     Specimen:  Antrum, gastric polyps    Patient was taken to the endoscopy suite.  Given appropriate intravenous sedation.  Video gastroscope inserted into the posterior pharynx and directed distally into the esophagus.  Esophagus was transversed and the stomach entered without difficulty.  The stomach was insufflated with air.  Scope was advanced to the level of the pylorus.  Pylorus was cannulated.  The first and second portion of the duodenum visualized without evidence of any abnormality.  Scope was withdrawn back into the distal stomach.  Single biopsy was taken for H. pylori at this level.  There were mild changes of superficial gastritis noted within the antrum.  The scope was withdrawn back.  The body and the fundus showed numerous hyperplastic appearing gastric polyps.  Representative biopsies taken of several these to confirm their benign nature.  The scope was retroflexed.  The gastroesophageal junction visualized from below.  There was a 3 cm hiatal hernia and no other specific abnormality.  Scope was then withdrawn back to the main course of the esophageal lumen.  There is no significant irregularity at the level of the squamocolumnar junction.  No signs of esophagitis.  There is no luminal constriction, or any other abnormality noted upon withdrawing back through the esophageal lumen.  Scope was then withdrawn, and this concluded the procedure patient tolerated well.    Bridgett Larsson MD MBA CPE FACS

## 2022-05-08 NOTE — Anesthesia Postprocedure Evaluation (Signed)
Anesthesia Post Op Evaluation    Patient: Bonnie Kline  Procedure(s):  EGD WITH BIOPSY    Last Vitals:Temperature: 36.6 C (97.9 F) (05/08/22 0902)  Heart Rate: 50 (05/08/22 0902)  BP (Non-Invasive): 124/64 (05/08/22 0902)  Respiratory Rate: 16 (05/08/22 0902)  SpO2: 97 % (05/08/22 0902)    No notable events documented.    Patient is sufficiently recovered from the effects of anesthesia to participate in the evaluation and has returned to their pre-procedure level.  Patient location during evaluation: PACU       Patient participation: complete - patient participated  Level of consciousness: awake and alert and responsive to verbal stimuli    Pain score: 0  Pain management: adequate  Airway patency: patent    Anesthetic complications: no  Cardiovascular status: acceptable  Respiratory status: acceptable  Hydration status: acceptable  Patient post-procedure temperature: Pt Normothermic   PONV Status: Absent

## 2022-05-08 NOTE — Discharge Instructions (Addendum)
SURGICAL DISCHARGE INSTRUCTIONS     Dr. Shirlee More, MD  performed your EGD WITH BIOPSY today at the [facility]:  Monday through Friday from 8 a.m. - 4 p.m.: [phone]    For T&D: [phone]  Between 4 p.m. - 8 a.m., weekends and holidays:  Call ER [phone]    PLEASE SEE WRITTEN HANDOUTS AS DISCUSSED BY YOUR NURSE:  Anderson Malta, RN    ANESTHESIA INFORMATION   ANESTHESIA -- ADULT PATIENTS:  You have received intravenous sedation / general anesthesia, and you may feel drowsy and light-headed for several hours. You may even experience some forgetfulness of the procedure. DO NOT DRIVE A MOTOR VEHICLE or perform any activity requiring complete alertness or coordination until you feel fully awake in about 24-48 hours. Do not drink alcoholic beverages for at least 24 hours. Do not stay alone, you must have a responsible adult available to be with you. You may also experience a dry mouth or nausea for 24 hours. This is a normal side effect and will disappear as the effects of the medication wear off.    REMEMBER   If you experience any difficulty breathing, chest pain, bleeding that you feel is excessive, persistent nausea or vomiting or for any other concerns:  Call your physician Dr.  Shirlee More, MD   at [phone] . You may also ask to have the general doctor on call paged. They are available to you 24 hours a day.      SPECIAL INSTRUCTIONS / COMMENTS   Today's findings: Gastritis  Hyperplastic gastric polyps  3cm Hiatal Hernia     FOLLOW-UP APPOINTMENTS   Please call your surgeon's office at the number listed to schedule a date / time of return for follow-up.     Dr Shirlee More

## 2022-05-09 ENCOUNTER — Telehealth (INDEPENDENT_AMBULATORY_CARE_PROVIDER_SITE_OTHER): Payer: Self-pay | Admitting: Surgery

## 2022-05-09 DIAGNOSIS — K2950 Unspecified chronic gastritis without bleeding: Secondary | ICD-10-CM

## 2022-05-09 DIAGNOSIS — K31A11 Gastric intestinal metaplasia without dysplasia, involving the antrum: Secondary | ICD-10-CM

## 2022-05-09 DIAGNOSIS — K317 Polyp of stomach and duodenum: Secondary | ICD-10-CM

## 2022-05-09 LAB — SURGICAL PATHOLOGY SPECIMEN

## 2022-06-17 ENCOUNTER — Ambulatory Visit (HOSPITAL_PSYCHIATRIC): Payer: Medicare Other | Admitting: NURSE PRACTITIONER-PSYCHIATRIC-MENTAL HEALTH

## 2022-06-20 ENCOUNTER — Encounter (HOSPITAL_PSYCHIATRIC): Payer: Self-pay | Admitting: NURSE PRACTITIONER-PSYCHIATRIC-MENTAL HEALTH

## 2022-06-20 ENCOUNTER — Ambulatory Visit
Payer: Medicare Other | Attending: NURSE PRACTITIONER-PSYCHIATRIC-MENTAL HEALTH | Admitting: NURSE PRACTITIONER-PSYCHIATRIC-MENTAL HEALTH

## 2022-06-20 ENCOUNTER — Other Ambulatory Visit: Payer: Self-pay

## 2022-06-20 VITALS — BP 123/66 | HR 71 | Resp 18 | Ht 66.0 in | Wt 178.0 lb

## 2022-06-20 DIAGNOSIS — Z634 Disappearance and death of family member: Secondary | ICD-10-CM | POA: Insufficient documentation

## 2022-06-20 DIAGNOSIS — F332 Major depressive disorder, recurrent severe without psychotic features: Secondary | ICD-10-CM | POA: Insufficient documentation

## 2022-06-20 DIAGNOSIS — F411 Generalized anxiety disorder: Secondary | ICD-10-CM | POA: Insufficient documentation
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
## 2022-07-11 ENCOUNTER — Other Ambulatory Visit: Payer: Self-pay

## 2022-07-11 ENCOUNTER — Encounter (HOSPITAL_PSYCHIATRIC): Payer: Self-pay | Admitting: NURSE PRACTITIONER-PSYCHIATRIC-MENTAL HEALTH

## 2022-07-11 ENCOUNTER — Ambulatory Visit
Payer: Medicare Other | Attending: NURSE PRACTITIONER-PSYCHIATRIC-MENTAL HEALTH | Admitting: NURSE PRACTITIONER-PSYCHIATRIC-MENTAL HEALTH

## 2022-07-11 VITALS — BP 121/70 | HR 58 | Resp 18 | Ht 66.0 in | Wt 175.0 lb

## 2022-07-11 DIAGNOSIS — Z634 Disappearance and death of family member: Secondary | ICD-10-CM | POA: Insufficient documentation

## 2022-07-11 DIAGNOSIS — F332 Major depressive disorder, recurrent severe without psychotic features: Secondary | ICD-10-CM | POA: Insufficient documentation

## 2022-07-11 DIAGNOSIS — F411 Generalized anxiety disorder: Secondary | ICD-10-CM | POA: Insufficient documentation
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
## 2022-08-08 ENCOUNTER — Ambulatory Visit (HOSPITAL_PSYCHIATRIC): Payer: Medicare Other | Admitting: NURSE PRACTITIONER-PSYCHIATRIC-MENTAL HEALTH

## 2022-08-14 ENCOUNTER — Ambulatory Visit (HOSPITAL_COMMUNITY): Payer: Medicare Other | Admitting: FAMILY PRACTICE

## 2022-08-14 ENCOUNTER — Other Ambulatory Visit (HOSPITAL_COMMUNITY): Payer: Self-pay | Admitting: FAMILY PRACTICE

## 2022-08-14 ENCOUNTER — Other Ambulatory Visit: Payer: Self-pay

## 2022-08-14 ENCOUNTER — Ambulatory Visit
Admission: RE | Admit: 2022-08-14 | Discharge: 2022-08-14 | Disposition: A | Discharge status: Home / Self Care | Payer: Medicare Other | Source: Ambulatory Visit | Attending: FAMILY PRACTICE | Admitting: FAMILY PRACTICE

## 2022-08-14 DIAGNOSIS — N390 Urinary tract infection, site not specified: Secondary | ICD-10-CM

## 2022-08-14 DIAGNOSIS — M25571 Pain in right ankle and joints of right foot: Secondary | ICD-10-CM

## 2022-08-14 DIAGNOSIS — R52 Pain, unspecified: Secondary | ICD-10-CM | POA: Insufficient documentation

## 2022-08-16 LAB — URINE CULTURE: URINE CULTURE: >100000 — AB

## 2022-09-15 ENCOUNTER — Encounter (HOSPITAL_PSYCHIATRIC): Payer: Self-pay | Admitting: NURSE PRACTITIONER-PSYCHIATRIC-MENTAL HEALTH

## 2022-09-16 NOTE — Telephone Encounter (Signed)
Patient has an apt with you on the 23rd. I am unable to see any release of information forms in her chart. ibis steven is her alternate contact. I will message the patient to let them know when the apt is to fill out a release of information that day. Thank you.

## 2022-09-16 NOTE — Telephone Encounter (Signed)
From: Rueben Bash  To: Jeanice Lim Disibbio  Sent: 09/15/2022 5:22 PM EDT  Subject: Concerns to be addressed at upcoming appt    I am writing this on behalf of [name]'s daughters  We are seeing changes in our mother's daily struggles. She seems to be having more days of deeper depression, anxiety, memory issues and confusion. Currently we have an appointment with the endocrinologist, Dr. Dortha Schwalbe in [place] on June 17th. There is a first degree relative with hyperparathyroidism, her sister Marisue Humble, which had similar symptoms. Her parathyroid was removed and symptoms were gone. Mom has high blood calcium levels and a high PTH levels. We want to check this out to see if it is causing issues with the above symptoms.

## 2022-09-18 ENCOUNTER — Ambulatory Visit
Payer: Medicare Other | Attending: NURSE PRACTITIONER-PSYCHIATRIC-MENTAL HEALTH | Admitting: NURSE PRACTITIONER-PSYCHIATRIC-MENTAL HEALTH

## 2022-09-18 ENCOUNTER — Other Ambulatory Visit: Payer: Self-pay

## 2022-09-18 ENCOUNTER — Encounter (HOSPITAL_PSYCHIATRIC): Payer: Self-pay | Admitting: NURSE PRACTITIONER-PSYCHIATRIC-MENTAL HEALTH

## 2022-09-18 VITALS — BP 126/75 | HR 64 | Resp 18 | Ht 67.0 in | Wt 175.0 lb

## 2022-09-18 DIAGNOSIS — F411 Generalized anxiety disorder: Secondary | ICD-10-CM | POA: Insufficient documentation

## 2022-09-18 DIAGNOSIS — Z634 Disappearance and death of family member: Secondary | ICD-10-CM | POA: Insufficient documentation

## 2022-09-18 DIAGNOSIS — F332 Major depressive disorder, recurrent severe without psychotic features: Secondary | ICD-10-CM | POA: Insufficient documentation

## 2022-09-18 MED ORDER — BUPROPION HCL 100 MG TABLET
100.0000 mg | ORAL_TABLET | Freq: Two times a day (BID) | ORAL | 0 refills | Status: DC
Start: 2022-09-18 — End: 2023-11-27

## 2022-09-18 MED ORDER — FLUOXETINE 40 MG CAPSULE
40.0000 mg | ORAL_CAPSULE | Freq: Every day | ORAL | 1 refills | Status: AC
Start: 2022-09-18 — End: ?

## 2022-09-18 MED ORDER — FLUOXETINE 20 MG CAPSULE
20.0000 mg | ORAL_CAPSULE | Freq: Every day | ORAL | 1 refills | Status: DC
Start: 2022-09-18 — End: 2023-04-13
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
## 2022-09-29 ENCOUNTER — Other Ambulatory Visit: Payer: Self-pay

## 2022-09-29 ENCOUNTER — Ambulatory Visit: Payer: Medicare Other | Attending: Psychiatry | Admitting: Psychiatry

## 2022-09-29 ENCOUNTER — Encounter (HOSPITAL_PSYCHIATRIC): Payer: Self-pay | Admitting: Psychiatry

## 2022-09-29 VITALS — BP 139/68 | HR 68 | Resp 18 | Ht 66.0 in | Wt 175.0 lb

## 2022-09-29 DIAGNOSIS — F332 Major depressive disorder, recurrent severe without psychotic features: Secondary | ICD-10-CM

## 2022-09-29 DIAGNOSIS — F411 Generalized anxiety disorder: Secondary | ICD-10-CM

## 2022-09-29 NOTE — Progress Notes (Signed)
[facility]  BEHAVIORAL MEDICINE, [facility] [place]  Operated by [facility]    New Patient Evaluation    Patient's Full Name: Bonnie Kline   Patient's Date of Birth: May 01, 1950   Patient's Age: 72 y.o.   Patient's Legal Sex: female   Patient's MRN: [id number]   Current Date: 09/29/2022 14:52       Chief Complaint:  "I feel like I got into a deeper depression"    History of Present Illness:  Patient states she has been isolating inside, not doing anything, has 3 adult children who help her a lot, patient states she has poor concentration, notices when doing her checkbook, drops things, feels there is something out there that can help, states this has been going on for the past couple of months, states she is one of 8 children who all struggle with depression and anxiety, states she lost her husband 7 years ago, states she wants to get out of this mood, states she isn't usually like this, usually good sense of humor and outgoing, denies suicidal thoughts, has 3 daughters and 6 grandchildren, denies homicidal thoughts, denies hallucinations; states husband of her granddaughter is going off to the [employer], granddaughter and great grandchild also moving away, doesn't want them to go too far, states she uses a CPAP and gets about 4-7 hours a night of sleep; states when she falls asleep she typically stays asleep and doesn't wake up multiple times, hobbies including sewing, states her sewing machine broke 3 years ago, hasn't gotten a new one; likes making things for people, helping others, has a car and drives on her own; sits at her home and sits in recliner, watches TV, falls asleep, is her typical day, talking on the phone; sometimes family visits; states doctor in [place], [place] had mention "pseudodementia" to her in the past; recent [facility] 29/30    Psychiatric History:    Past Diagnoses: major depression    Treatment History:   History of Inpatient Psychiatric Treatment?  Denies    History of Outpatient Psychiatric Treatment? [facility], sees Fremont Disibbio, NP; for 1-2 years; has never been in group therapy       Substance Abuse History:    Alcohol Use: denies     Drug Use: denies    Tobacco Use: denies    Substance Abuse Comments: denies history of detox/rehab       Medications and Allergies:    Allergies:  Allergies   Allergen Reactions    Amoxicillin Rash    Clavulanic Acid Rash    Penicillins Rash      (I.e. ACE Inhibitors, NSAIDs, ASA, Fish/Seafood, Latex, Nuts, Penicillin, Sulfa)    Current Psychiatric Medications: Prozac 60 mg daily in the morning (started Prozac in 1986), Wellbutrin 100 mg po daily (couple days)    Social History:    Living Situation: live alone in [place], [place], [place]     Marital Status: Widowed     Highest Level of Education: high school diploma, cosmetology degree     State of Employment: social security, spousal benefits       Vital Signs:    Vitals:    09/29/22 1247   BP: 139/68   Pulse: 68   Resp: 18   Weight: 79.4 kg (175 lb)   Height: 1.676 m (5\' 6" )   BMI: 28.3        Labs:    No results found for this or any previous visit (from the  past 24 hour(s)).     Medication List:     aspirin (ECOTRIN) 81 mg Oral Tablet, Delayed Release (E.C.) Take 1 Tablet (81 mg total) by mouth Once a day    buPROPion (WELLBUTRIN) 100 mg Oral Tablet Take 1 Tablet (100 mg total) by mouth Twice daily    cholecalciferol, vitamin D3, (VITAMIN D-3) 50 mcg (2,000 unit) Oral Tablet Take 1 Tablet (2,000 Units total) by mouth Once a day    diosmin complex no.1 (VASCULERA) 630 mg Oral Tablet Take 1 Tablet (630 mg total) by mouth Once a day    ferrous sulfate (FERATAB) 324 mg (65 mg iron) Oral Tablet, Delayed Release (E.C.) Take 1 Tablet (324 mg total) by mouth Once a day    FLUoxetine (PROZAC) 20 mg Oral Capsule Take 1 Capsule (20 mg total) by mouth Once a day Take with 40 mg pill for total dose of 60 mg every morning    FLUoxetine (PROZAC) 40 mg Oral Capsule Take 1 Capsule (40 mg total) by  mouth Once a day    latanoprost (XALATAN) 0.005 % Ophthalmic Drops Instill 1 Drop into both eyes Once a day    Magnesium Glycinate 100 mg Oral Tablet Take 4 Tablets (400 mg total) by mouth Once a day    multivitamin-minerals-lutein (MULTIVITAMIN 50 PLUS) Oral Tablet Take 1 Tablet by mouth Once a day (Patient not taking: Reported on 09/29/2022)    pantoprazole (PROTONIX) 40 mg Oral Tablet, Delayed Release (E.C.) Take 1 Tablet (40 mg total) by mouth Once a day    pravastatin (PRAVACHOL) 20 mg Oral Tablet Take 1 Tablet (20 mg total) by mouth Once a day    SIMBRINZA 1-0.2 % Ophthalmic Drops, Suspension Instill 1 Drop into both eyes Twice daily    solifenacin (VESICARE) 10 mg Oral Tablet Take 1 Tablet (10 mg total) by mouth Once a day    timoloL maleate (TIMOPTIC) 0.25 % Ophthalmic Drops Instill 1 Drop into both eyes Every evening (Patient not taking: Reported on 09/29/2022)        Mental Status Examination:    Sensorium/Alertness: Alert, Awake  Orientation: Date, Person, Place, Situation  Appearance:Appears stated age  Psychomotor Activity: Normal  Abnormal Behaviors: None  Attitude Towards Examiner: Attentive, Cooperative  Eye Contact: Normal  Speech: Normal/Spontaneous  Mood: "depressed"  Affect: Flat  Perception: WNL  Though Process: Logical/Clear/Goal Oriented, Linear  Thought Content: Suicidal? denies  Thought Content: Homicidal? denies  Thought Content: Delusions? denies  Impulse Control: Within Normal Limits  Concentration/Calculation/Attention Span: WNL  How was the patient's Concentration/Calculation/Attention tested/assessed? Per observation and interview with patient   Recent Memory: WNL  Remote Memory: WNL  How was the patient's Remote Memory Tested/Assessed? Past Events, as it relates to history  Intelligence/Fund of Knowledge: Average  How was the patient's Intelligence/Fund of Knowledge Tested/Assessed? Based on history, Based on vocabulary, syntax, grammar, and content  Judgement: Fair  How was the patient's  Judgement Tested/Assessed? Per patient's behavior/history of present illness  Insight: Fair  How was the patient's Insight Tested/Assessed? Understanding of severity of illness/history of present illness       Assessment and Plan:    Patient presents to the hospital in the setting of worsening depression, decline in functioning. Patient wanting assistance of group therapy in addition to medications.     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 psychotic features  Generalized anxiety disorder     -continue current medication regimen; Prozac 60 mg po daily and Wellbutrin 100 mg po  daily for depression   -start IOP groups 3x per week to avoid inpatient treatment and further decompensation   -patient has crisis numbers should her symptoms worsen or she needs immediate assistance   -will follow-up with patient in approximately 4 weeks      Physician certification on level of care:  I certify that these outpatient behavioral health services are medically necessary to improve and maintain the patient's condition and functional level and prevent relapse or admission to a higher level of care.     Interaction Attestation: Clinical telemedicine services delivered using HIPAA-compliant interactive video-audio telecommunications while the patient and the rendering provider were not in the same physical location. Patient agreeable to telecommunication.    TELEMEDICINE DOCUMENTATION:    Patient Location:  [facility], [address], [place], [place] [address]  Provider Location: Remote  Patient/family aware of provider location:  yes  Patient/family consent for telemedicine:  yes  Examination observed and performed by:  Claudette Laws, DO    Emi Belfast Barry Dienes, DO  Psychiatrist  Medical Director

## 2022-09-30 ENCOUNTER — Encounter (HOSPITAL_PSYCHIATRIC): Payer: Self-pay

## 2022-10-01 ENCOUNTER — Encounter (HOSPITAL_PSYCHIATRIC): Payer: Self-pay

## 2022-10-01 ENCOUNTER — Ambulatory Visit: Payer: Medicare Other | Attending: Psychiatry

## 2022-10-01 DIAGNOSIS — F332 Major depressive disorder, recurrent severe without psychotic features: Secondary | ICD-10-CM | POA: Insufficient documentation

## 2022-10-01 DIAGNOSIS — F411 Generalized anxiety disorder: Secondary | ICD-10-CM | POA: Insufficient documentation

## 2022-10-01 NOTE — Group Note (Signed)
BEHAVIORAL MEDICINE, [facility] [place]  [address]  [place] [address]  Operated by [facility]  Group Note             Name: LESSLI DEBATES   Date of Birth: 10/07/1950   Today's Date: 10/01/2022   Group Start Time:  9:30 AM   Group End Time: 10:28 AM   Group Topic: Intensive Outpatient Program  Number of participants: 4      Summary of group discussion:   Group discussion included small wins that have happened for them since the last group meeting.  Reflecting on "wins" encourages positive thinking and builds self esteem.       All were alert.  All were active participants.       Zamyia's Participation and Response: Today is [name]'s first day of group.  She is neat, well-groomed, and has a pleasant smile.  She is welcomed by the group.  She shared the care and support she receives from her 3 grown children (and grandchildren).        Suicidal/Homicidal Risk:  Currently denies SI/HI and expresses willingness to contact crisis services if needed.

## 2022-10-01 NOTE — Group Note (Signed)
BEHAVIORAL MEDICINE, [facility] [place]  [address]  [place] [address]  Operated by [facility]  Group Note             Name: [name]   Date of Birth: 07-Nov-1950   Today's Date: 10/01/2022   Group Start Time: 11:30 AM   Group End Time: 12:29 PM   Group Topic: Intensive Outpatient Program  Number of participants: 4      Summary of group discussion:  Group discussed ways to help manage anxiety attacks.  Emphasis was placed on reducing known triggers, eliminating triggers if possible, building on positive habits, and implementing positive behaviors into everyday living.       All shared different strategies they have found helpful. Reviewed the "3  minute task", lists and postings, being proactive, practice, and building routines.       All were alert.  All were active participants.       Kattie's Participation and Response: [name] recalled an anxiety or 2 she has experienced in the past.  As group shared their symptoms when these occur, she acknowledged these happen to her, too.  She has been walking some in her neighborhood.  She has been active n the past and is not able to do what she once did due to changes in her physical and emotional health.  She sometimes talks with one of her neighbors and enjoys this.  She is encouraged to continue to do these things.       Suicidal/Homicidal Risk:  Currently denies SI/HI and expresses willingness to contact crisis services if needed.

## 2022-10-01 NOTE — Group Note (Signed)
BEHAVIORAL MEDICINE, [facility] [place]  [address]  [place] [address]  Operated by [facility]  Group Note             Name: Bonnie Kline   Date of Birth: 01-09-1951   Today's Date: 10/01/2022   Group Start Time: 10:30 AM   Group End Time: 11:29 AM   Group Topic: Intensive Outpatient Program  Number of participants: 4      Summary of group discussion:   Group discussed different situations that included wanting to and knowing to do something but not doing it.  Some things shared included a time of relapse from a substance use times of not doing certain tasks such as self-care, Home maintenance, being in contact with other people, or keeping appointments.          These included periods of high stress, worries, and depression.  All were alert and all were active participants.       Bonnie Kline's Participation and Response: Bonnie Kline shared some hx of the passing of her spouse ([id number]?).  She has had heart surgery.  She has multiple screws and 2 plates in one ankle.  She has a walker she uses at her home. She has had carpal tunnel surgery. She has lost motivation and energy.  This has been since last fall. She looses her train of thought and struggle to find a "word" she is wanting to say.    Sleep is good at 4-6 hours.     Suicidal/Homicidal Risk:  Currently denies SI/HI and expresses willingness to contact crisis services if needed.

## 2022-10-02 ENCOUNTER — Ambulatory Visit: Payer: Medicare Other | Attending: Psychiatry

## 2022-10-02 DIAGNOSIS — F411 Generalized anxiety disorder: Secondary | ICD-10-CM | POA: Insufficient documentation

## 2022-10-02 DIAGNOSIS — R4789 Other speech disturbances: Secondary | ICD-10-CM | POA: Insufficient documentation

## 2022-10-02 DIAGNOSIS — R413 Other amnesia: Secondary | ICD-10-CM | POA: Insufficient documentation

## 2022-10-02 DIAGNOSIS — F332 Major depressive disorder, recurrent severe without psychotic features: Secondary | ICD-10-CM | POA: Insufficient documentation

## 2022-10-02 NOTE — Group Note (Signed)
BEHAVIORAL MEDICINE, [facility] [place]  [address]  [place] [address]  Operated by [facility]  Group Note             Name: VERDELLA LAIDLAW   Date of Birth: Jul 09, 1950   Today's Date: 10/02/2022   Group Start Time:  9:30 AM   Group End Time: 10:29 AM   Group Topic: Intensive Outpatient Program  Number of participants: 5      Summary of group discussion:  Group discussed something they have had a problem handling within the last week.  Everyone was given an opportunity to share what the problem was and how it was handled.  Potential ways to address it differently were also discussed.      Khamiya's Participation and Response: [name] has been wanting to clear out things in one of her spare rooms (since Christmas).  Yesterday, after group, she cleared it and is feeling good about doing it.  She has bagged the items and has thoughts on where she will take/give the things to.        Suicidal/Homicidal Risk:  Currently denies SI/HI and expresses willingness to contact crisis services if needed.

## 2022-10-02 NOTE — Group Note (Signed)
BEHAVIORAL MEDICINE, [facility] [place]  [address]  [place] [address]  Operated by [facility]  Group Note             Name: Bonnie Kline   Date of Birth: 12-02-1950   Today's Date: 10/02/2022   Group Start Time: 10:30 AM   Group End Time: 11:27 AM   Group Topic: Intensive Outpatient Program  Number of participants: 5      Summary of group discussion:   Group discussed current physical medical conditions and their current treatments.  Also discussed symptoms that they want to discuss with their providers.         Physical body symptoms increase worries and anxiety.  Physical symptoms also impacts symptoms of depression.  Mind and body connections were a focus.       They are encouraged to write their concerns and questions in their Health Journal to help them remember and address these with their doctors.         All were active participants.      [name]'s Participation and Response: [name] shared hx of difficulties with speech.  Speaking is challenging.  Complete sentences and finding the right word to express is hard.  She knows when this happens and becomes frustrated with herself.  She struggles to communicate what she wants to say, can lose her train of thought, and then forget what she was talking about.       She has had heart surgery.  She uses a c-pap machine that is 73 years old that registers different hours sleep than she records. She takes an OTC Vit D now.  She has taken 50,000 IU prescribed Vit D in the past. She has recurring severe depression. A sister has had parathyroid surgery. One of her daughters has sugar issues.     Suicidal/Homicidal Risk:  Currently denies SI/HI and expresses willingness to contact crisis services if needed.

## 2022-10-02 NOTE — Group Note (Signed)
BEHAVIORAL MEDICINE, [facility] [place]  [address]  [place] [address]  Operated by [facility]  Group Note             Name: Bonnie Kline   Date of Birth: [birth year]   Today's Date: 10/02/2022   Group Start Time: 11:30 AM   Group End Time: 12:30 PM   Group Topic: Intensive Outpatient Program  Number of participants: 5      Summary of group discussion:   Group was asked to identify symptoms of depression, anxiety, stress, anger they have experienced over the past month.  They wrote several and shared a couple with group verbally.  Group discussed the importance of being aware of these when they are happening.  They discuss recognizing these when they first start occurring and to seek help then:  "red flags".       All participated.        Kyndal's Participation and Response: [name] responded: depression, snapping at people, types of food I eat, symptom-house, procrastinate.        Suicidal/Homicidal Risk:  Currently denies SI/HI and expresses willingness to contact crisis services if needed.

## 2022-10-06 ENCOUNTER — Ambulatory Visit (HOSPITAL_PSYCHIATRIC): Payer: Medicare Other

## 2022-10-06 DIAGNOSIS — R413 Other amnesia: Secondary | ICD-10-CM

## 2022-10-06 DIAGNOSIS — F332 Major depressive disorder, recurrent severe without psychotic features: Secondary | ICD-10-CM

## 2022-10-06 DIAGNOSIS — F411 Generalized anxiety disorder: Secondary | ICD-10-CM | POA: Insufficient documentation

## 2022-10-06 DIAGNOSIS — R4789 Other speech disturbances: Secondary | ICD-10-CM | POA: Insufficient documentation

## 2022-10-06 NOTE — Group Note (Signed)
BEHAVIORAL MEDICINE, [facility] [place]  [address]  [place] [address]  Operated by [facility]  Group Note             Name: Bonnie Kline   Date of Birth: 13-Apr-1951   Today's Date: 10/06/2022   Group Start Time:  9:30 AM   Group End Time: 10:25 AM   Group Topic: Intensive Outpatient Program  Number of participants: 6      Summary of group discussion:   Group discussed something that has been a challenge to handle over the past week.  They shared what that was and how they managed.  Coping skills were shared.      Multiple suggestions on ways to handle the challenge were talked about and these were also included as part of the Safety Plan Review.          All were alert and all were active participants.       [name]'s Participation and Response: Bonnie Kline is thankful for her 3 girls.  She has a relative that will be moving to another state this summer.  This is the couple that has a 87 1/2-11 year old that has been with Bonnie Kline when Bonnie Kline has fallen. Bonnie Kline said she continues to get off balance and has trouble remembering and pulling up words.  This past weekend she made a "twirl" in her driveway that was not planned.  She was not dizzy and it did not impact her balance.       Suicidal/Homicidal Risk:  Currently denies SI/HI and expresses willingness to contact crisis services if needed.

## 2022-10-06 NOTE — Group Note (Signed)
BEHAVIORAL MEDICINE, [facility] [place]  [address]  [place] [address]  Operated by [facility]  Group Note             Name: Bonnie Kline   Date of Birth: May 12, 1950   Today's Date: 10/06/2022   Group Start Time: 11:30 AM   Group End Time: 12:29 PM   Group Topic: Intensive Outpatient Program  Number of participants: 6      Summary of group discussion:   Group reviewed typical symptoms they experience when emotions are elevated (such as anxiety).  They were encouraged to share at least one way they help to manage or cope with those symptoms while they are happening and also given an opportunity to share a way to help prevent or decrease the symptoms.        All agreed to reduce/eliminate situations that create the negative emotions and to work on ways to improve situations/people relationships when possible.        [name]'s Participation and Response: Bonnie Kline enjoys talking with others.  She has some friends that eat lunch together once a month.  She enjoys serving at [employer], she talks with her girls daily, and she spends time talking with her neighbors.      Suicidal/Homicidal Risk:  Currently denies SI/HI and expresses willingness to contact crisis services if needed.

## 2022-10-06 NOTE — Group Note (Signed)
BEHAVIORAL MEDICINE, [facility] [place]  [address]  [place] [address]  Operated by [facility]  Group Note             Name: Bonnie Kline   Date of Birth: 01-16-1951   Today's Date: 10/06/2022   Group Start Time: 10:30 AM   Group End Time: 11:29 AM   Group Topic: Intensive Outpatient Program  Number of participants: 6      Summary of group discussion:   Group talked about a cluster of symptoms that describe a panic attack.  All gave symptoms of anxiety and most described having panic attacks.        All were alert and all were active participants.       Bonnie Kline's Participation and Response: Bonnie Kline described:  hard to breathe, shakes, feels confused, muscles weaken, crying (tears just flow).      Suicidal/Homicidal Risk:  Currently denies SI/HI and expresses willingness to contact crisis services if needed.

## 2022-10-08 ENCOUNTER — Ambulatory Visit (HOSPITAL_PSYCHIATRIC): Payer: Medicare Other

## 2022-10-08 DIAGNOSIS — F332 Major depressive disorder, recurrent severe without psychotic features: Secondary | ICD-10-CM

## 2022-10-08 DIAGNOSIS — R413 Other amnesia: Secondary | ICD-10-CM | POA: Insufficient documentation

## 2022-10-08 DIAGNOSIS — R4789 Other speech disturbances: Secondary | ICD-10-CM

## 2022-10-08 DIAGNOSIS — F411 Generalized anxiety disorder: Secondary | ICD-10-CM | POA: Insufficient documentation

## 2022-10-08 NOTE — Group Note (Signed)
BEHAVIORAL MEDICINE, [facility] [place]  [address]  [place] [address]  Operated by [facility]  Group Note             Name: MALENI SEYER   Date of Birth: 1951/01/01   Today's Date: 10/08/2022   Group Start Time:  9:30 AM   Group End Time: 10:30 AM   Group Topic: Intensive Outpatient Program  Number of participants: 7      Summary of group discussion:   Group members were given the opportunity to talk about items on the Patient Self-Report that is asked to be ranked each group session.  Questions 1-7 include: sleep, appetite, depression, anxiety, self-esteem, physical pain and management of symptoms.             Cardelia's Participation and Response: [name] continues to be delayed in speech, verbal responses, looses concentration, short term memory, and word recall.  Comprehension is impaired. Responding in writing to one sentence yes/no and raking on 1-10 type of questions are challenging.         Suicidal/Homicidal Risk:  Currently denies SI/HI and expresses willingness to contact crisis services if needed.

## 2022-10-08 NOTE — Group Note (Signed)
BEHAVIORAL MEDICINE, [facility] [place]  [address]  [place] [address]  Operated by [facility]  Group Note             Name: Bonnie Kline   Date of Birth: November 30, 1950   Today's Date: 10/08/2022   Group Start Time: 10:30 AM   Group End Time: 11:29 AM   Group Topic: Intensive Outpatient Program  Number of participants: 7      Summary of group discussion:   Today, group discussed the benefits of deep breathing to help relax and manage anxiety.  All took and recorded their blood pressure, heart rate and oxygen levels using a blood pressure cuff and oxo meter. Deep breathing was illustrated and all were encouraged to practice in group as well as at home.       Other things that have similar symptoms were also discussed such as gall bladder, heart, and GERD.  All participated in the discussion.  All practiced deep breathing.      Ladean's Participation and Response: [name] received help taking her b/p, h/r and O2.  She does not recall having panic attacks.  She describes experiencing a cluster of symptoms that could be "anxiety attack" symptoms.      Suicidal/Homicidal Risk:  Currently denies SI/HI and expresses willingness to contact crisis services if needed.

## 2022-10-08 NOTE — Group Note (Signed)
BEHAVIORAL MEDICINE, [facility] [place]  [address]  [place] [address]  Operated by [facility]  Group Note             Name: Bonnie Kline   Date of Birth: 05-Jan-1951   Today's Date: 10/08/2022   Group Start Time: 11:30 AM   Group End Time: 12:28 PM   Group Topic: Intensive Outpatient Program  Number of participants: 5      Summary of group discussion:   Group talked about the importance nutrition and meal hygiene play in good health.  The lunch provided today was used as an example of food group choices, variety, color, smell, fellowship, appreciation, nourishment.       All were encouraged to identify one thing they can and are willing to "do" to improve their health through nutrition.  All participated. All will be in their personal goal this week and "do" it until the end of this month.      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  She quit drinking soda a while back and picked it back up.  She usually drinks one and sometimes 2 cans per day.  They are the "little cans".  She currently has 2 small cans at her home.  She is not going to buy anymore soda.  If she wants to drink the 2 she has, ok and if not ok.        Suicidal/Homicidal Risk:  Currently denies SI/HI and expresses willingness to contact crisis services if needed.

## 2022-10-09 ENCOUNTER — Ambulatory Visit (HOSPITAL_PSYCHIATRIC): Payer: Medicare Other

## 2022-10-09 DIAGNOSIS — F332 Major depressive disorder, recurrent severe without psychotic features: Secondary | ICD-10-CM | POA: Insufficient documentation

## 2022-10-09 DIAGNOSIS — R419 Unspecified symptoms and signs involving cognitive functions and awareness: Secondary | ICD-10-CM

## 2022-10-09 DIAGNOSIS — F411 Generalized anxiety disorder: Secondary | ICD-10-CM

## 2022-10-09 DIAGNOSIS — R413 Other amnesia: Secondary | ICD-10-CM

## 2022-10-09 DIAGNOSIS — R4789 Other speech disturbances: Secondary | ICD-10-CM

## 2022-10-09 NOTE — Group Note (Signed)
BEHAVIORAL MEDICINE, [facility] [place]  [address]  [place] [address]  Operated by [facility]  Group Note             Name: Bonnie Kline   Date of Birth: 1951-04-04   Today's Date: 10/09/2022   Group Start Time: 10:30 AM   Group End Time: 11:30 AM   Group Topic: Intensive Outpatient Program  Number of participants: 6      Summary of group discussion:   Group discussed self-report questions 9 and 10 which ask:  Are you thinking about dying?  If so, do you have a plan to do it?  If yes, please explain.       All selected yes or no on the self-report in  writing.  All were given an opportunity to discuss thoughts of self-harm, thought about dying, and suicide.       Zyanne's Participation and Response: Bonnie Kline was able to complete her self-report with the help of another group member.  She circled the yes no responses.  She does not write any sentences to identify something handled well or something that has been a challenge. The other group member also helped her with using the blood pressure machine, pulsox, and how to record her numbers. Bonnie Kline said she has never had thoughts of harming herself.        Suicidal/Homicidal Risk:  Currently denies SI/HI and expresses willingness to contact crisis services if needed.

## 2022-10-09 NOTE — Group Note (Signed)
BEHAVIORAL MEDICINE, [facility] [place]  [address]  [place] [address]  Operated by [facility]  Group Note             Name: Bonnie Kline   Date of Birth: 10/05/1950   Today's Date: 10/09/2022   Group Start Time:  9:30 AM   Group End Time: 10:29 AM   Group Topic: Intensive Outpatient Program  Number of participants: 6      Summary of group discussion:   Group discussed people they could ask for help when they are feeling down.  The discussion focused on people they know as family, friends,  neighbors vsThese medical or professional agencies.  These are people they trust, can be open and honest with, and are positive.       All were active and participated.      Analycia's Participation and Response: [name] identified her three children and two close neighbors.      Suicidal/Homicidal Risk:  Currently denies SI/HI and expresses willingness to contact crisis services if needed.

## 2022-10-09 NOTE — Group Note (Signed)
BEHAVIORAL MEDICINE[facility] [place]  [address]  [place] [address]  Operated by [facility]  Group Note             Name: Bonnie Kline   Date of Birth: [birth year]   Today's Date: 10/09/2022   Group Start Time: 11:30 AM   Group End Time: 12:29 PM   Group Topic: Intensive Outpatient Program  Number of participants: 6      Summary of group discussion:   Group reviewed and discussed their nutritional goal they identified in the previous group session (yesterday).  Some started implementation yesterday and others plan to start today.  Reasons for having these goals and the anticipated outcomes were talked about.  Some humor was shared by all.      Bonnie Kline's Participation and Response: Bonnie Kline has not drunk any Dr. Reino Kent.  Her daughter, Bonnie Kline is coming to her house and is taking Bonnie Kline to a medical appointment this coming Monday.         Bonnie Kline drove herself today and asked to speak with me prior to group.  I spent 30 minutes with her one on one.  She shared her thoughts about continuing to participate in group.  We explored her continuing or discontinuing.  We agree that she will give it some more thought and discuss it with her daughter, Bonnie Kline.       Bonnie Kline agreed to stay after group to complete an entry assessment titled "Outpatient Psychological Assessment" which all group members complete when they enter group.  Bonnie Kline was given this the first day of attendance. She gave incomplete responses to 3 of the questions.  She asked to take it home to complete and was not able to answer any additional questions. I have worked with her one-on-one during group breaks and also after group yesterday.  She agreed to stay until it was completed today.  We worked on this for 1 1/2 hours plus.        Bonnie Kline agreed for me to contact her daughter, a person listed as an emergency contact on the Outpatient Psychological Assessment about her symptoms and care.  Her daughter's name is: Bonnie Kline, [phone].        I left a message at this number to contact me.       Suicidal/Homicidal Risk:  Currently denies SI/HI and expresses willingness to contact crisis services if needed.

## 2022-10-10 ENCOUNTER — Encounter (HOSPITAL_PSYCHIATRIC): Payer: Self-pay

## 2022-10-10 NOTE — Behavioral Health (Signed)
Dr. Barry Dienes,    I have documented observations made for Bonnie Kline during SOP group sessions.    Please review these.    With [name]'s permission, I have also contacted one of her children.    The conversation with her daughter, [name], this morning, was insightful in regards to [facility] physical health.    [name] is questioning remaining in group and will contact us next week to let us know more about her medical report and also group.

## 2022-10-13 ENCOUNTER — Ambulatory Visit: Payer: Medicare Other | Attending: Psychiatry

## 2022-10-13 DIAGNOSIS — Z029 Encounter for administrative examinations, unspecified: Secondary | ICD-10-CM

## 2022-10-15 ENCOUNTER — Ambulatory Visit (HOSPITAL_PSYCHIATRIC): Payer: Medicare Other

## 2022-10-15 NOTE — Progress Notes (Signed)
The patient did not appear for their appointment/or scheduled appointment was cancelled.  This office visit opened in error.

## 2022-10-16 ENCOUNTER — Ambulatory Visit: Payer: Medicare Other | Attending: Psychiatry

## 2022-10-16 DIAGNOSIS — Z029 Encounter for administrative examinations, unspecified: Secondary | ICD-10-CM

## 2022-10-20 ENCOUNTER — Ambulatory Visit: Payer: Medicare Other | Attending: Psychiatry

## 2022-10-20 DIAGNOSIS — R4789 Other speech disturbances: Secondary | ICD-10-CM | POA: Insufficient documentation

## 2022-10-20 DIAGNOSIS — R413 Other amnesia: Secondary | ICD-10-CM | POA: Insufficient documentation

## 2022-10-20 DIAGNOSIS — F332 Major depressive disorder, recurrent severe without psychotic features: Secondary | ICD-10-CM | POA: Insufficient documentation

## 2022-10-20 DIAGNOSIS — R419 Unspecified symptoms and signs involving cognitive functions and awareness: Secondary | ICD-10-CM | POA: Insufficient documentation

## 2022-10-20 NOTE — Group Note (Signed)
BEHAVIORAL MEDICINE[facility] [place]  [address]  [place] [address]  Operated by [facility]  Group Note             Name: Bonnie Kline   Date of Birth: 11-Apr-1951   Today's Date: 10/20/2022   Group Start Time: 10:30 AM   Group End Time: 11:28 AM   Group Topic: Intensive Outpatient Program  Number of participants: 5      Summary of group discussion:   Group discussed contributors to anxiety and also ways to help manage.  After discussions, individuals were given an opportunity to share a specific area or situation that increases their anxiety and identify at least one coping skill that the can use to help reduce the anxiety.       All were alert and participated.       [name]'s Participation and Response: [name] shared her concerns for her grand daughter and her family preparing to move  Gd's spouse is in the [employer] and to be stationed soon.  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  She has a close relationship with her children, grandchildren, and great-grand children.  She plans to stay in contact...send mail, face time, and also keep   close contact with her daughter of the grandchild.         Another anxiety for her is her physical health.  She is anxious to take the tests the doctor has ordered for her and to get the results back.  She is hopeful she will be treated and begin healing.       Suicidal/Homicidal Risk:  Currently denies SI/HI and expresses willingness to contact crisis services if needed.

## 2022-10-20 NOTE — Group Note (Signed)
BEHAVIORAL MEDICINE[facility] [place]  [address]  [place] [address]  Operated by [facility]  Group Note             Name: Bonnie Kline   Date of Birth: 1950/08/02   Today's Date: 10/20/2022   Group Start Time:  9:30 AM   Group End Time: 10:29 AM   Group Topic: Intensive Outpatient Program  Number of participants: 5      Summary of group discussion:   Group discussion included symptoms of bipolar and how that can also have feelings and behaviors that may be included in depression, anxiety, and anger.       All group members shared different "poles" of this and some included both "poles" or "ups and downs".         Group ended with recognizing these symptoms, the goal of having techniques to help balance moods and the benefits of doing so.        All were alert and active participants.      [name]'s Participation and Response: Bonnie Kline went to her endocrinologist last Monday.  She will not be in group this Wednesday due to another medical appointment.  She is planning on keeping her appointment with Dr. Barry Dienes, too.  She has more symptoms on the down feeling.      Suicidal/Homicidal Risk:  Currently denies SI/HI and expresses willingness to contact crisis services if needed.

## 2022-10-20 NOTE — Group Note (Signed)
BEHAVIORAL MEDICINE[facility] [place]  [address]  [place] [address]  Operated by [facility]  Group Note             Name: Bonnie Kline   Date of Birth: [birth year]   Today's Date: 10/20/2022   Group Start Time: 11:30 AM   Group End Time: 12:29 PM   Group Topic: Intensive Outpatient Program  Number of participants: 5      Summary of group discussion:   Group talked about the benefits of attending and being an active participant in this group.  They agreed that it is a unique setting where they feel heard and understood.  They recognize the quality time they share (9 hours per week) and the quick friendships they make.  Waking up a routine, schedule, getting dressed, taking a shower, talking with other people, setting goals, having something to do, laughing, and learning were some of the things talked about.        Ways to recreate the benefits of group outside of group were discussed.  All were encouraged to begin to explore ways to do this if they have not already begun.      Bonnie Kline's Participation and Response: [name] attended an in door concert this past weekend at the [facility] with some family members.  She enjoyed it a lot.  She has done this in the past and hopes to do more  the future.  She has built a core group of friends and these include some close neighbors that she enjoys talking with.         She shared some happy memories growing up in a family of 8 siblings, her dad's Conservation officer, historic buildings, and her mom worked outside the home. Her flow of speech is smoother when she talks about her family and friends.  When talking about her health, it is more difficult for her to pull forward what she wants to say and connect what she is saying.      Suicidal/Homicidal Risk:  Currently denies SI/HI and expresses willingness to contact crisis services if needed.

## 2022-10-22 ENCOUNTER — Other Ambulatory Visit (HOSPITAL_COMMUNITY): Payer: Self-pay

## 2022-10-22 ENCOUNTER — Ambulatory Visit (HOSPITAL_PSYCHIATRIC): Payer: Medicare Other

## 2022-10-22 ENCOUNTER — Other Ambulatory Visit: Payer: Self-pay

## 2022-10-22 DIAGNOSIS — N281 Cyst of kidney, acquired: Secondary | ICD-10-CM

## 2022-10-23 ENCOUNTER — Ambulatory Visit (HOSPITAL_PSYCHIATRIC): Payer: Medicare Other

## 2022-10-23 ENCOUNTER — Ambulatory Visit (HOSPITAL_PSYCHIATRIC): Payer: Medicare Other | Admitting: Psychiatry

## 2022-10-27 ENCOUNTER — Other Ambulatory Visit (HOSPITAL_COMMUNITY): Payer: Self-pay | Admitting: FAMILY PRACTICE

## 2022-10-27 ENCOUNTER — Other Ambulatory Visit: Payer: Self-pay

## 2022-10-27 ENCOUNTER — Ambulatory Visit (HOSPITAL_PSYCHIATRIC): Payer: Medicare Other

## 2022-10-27 ENCOUNTER — Ambulatory Visit
Admission: RE | Admit: 2022-10-27 | Discharge: 2022-10-27 | Disposition: A | Discharge status: Home / Self Care | Payer: Medicare Other | Source: Ambulatory Visit | Attending: FAMILY PRACTICE | Admitting: FAMILY PRACTICE

## 2022-10-27 DIAGNOSIS — R202 Paresthesia of skin: Secondary | ICD-10-CM | POA: Insufficient documentation

## 2022-10-27 DIAGNOSIS — M542 Cervicalgia: Secondary | ICD-10-CM

## 2022-10-27 NOTE — Progress Notes (Signed)
The patient did not appear for their appointment/or scheduled appointment was cancelled.  This office visit opened in error.

## 2022-10-28 ENCOUNTER — Other Ambulatory Visit (HOSPITAL_COMMUNITY): Payer: Self-pay | Admitting: FAMILY PRACTICE

## 2022-10-28 DIAGNOSIS — M542 Cervicalgia: Secondary | ICD-10-CM

## 2022-10-28 NOTE — Addendum Note (Signed)
Addended by: Claudette Laws on: 10/28/2022 12:59 PM     Modules accepted: Orders

## 2022-11-03 ENCOUNTER — Other Ambulatory Visit (HOSPITAL_PSYCHIATRIC): Payer: Self-pay | Admitting: NURSE PRACTITIONER-PSYCHIATRIC-MENTAL HEALTH

## 2022-11-03 ENCOUNTER — Telehealth (HOSPITAL_PSYCHIATRIC): Payer: Self-pay | Admitting: NURSE PRACTITIONER-PSYCHIATRIC-MENTAL HEALTH

## 2022-11-03 ENCOUNTER — Ambulatory Visit (HOSPITAL_PSYCHIATRIC): Payer: Medicare Other

## 2022-11-03 NOTE — Telephone Encounter (Signed)
Refill too soon

## 2022-11-05 ENCOUNTER — Ambulatory Visit
Admission: RE | Admit: 2022-11-05 | Discharge: 2022-11-05 | Disposition: A | Discharge status: Home / Self Care | Payer: Medicare Other | Source: Ambulatory Visit | Attending: FAMILY PRACTICE | Admitting: FAMILY PRACTICE

## 2022-11-05 ENCOUNTER — Ambulatory Visit (HOSPITAL_PSYCHIATRIC): Payer: Medicare Other

## 2022-11-05 ENCOUNTER — Other Ambulatory Visit: Payer: Self-pay

## 2022-11-05 DIAGNOSIS — M542 Cervicalgia: Secondary | ICD-10-CM | POA: Insufficient documentation

## 2022-11-06 ENCOUNTER — Ambulatory Visit (HOSPITAL_PSYCHIATRIC): Payer: Medicare Other

## 2022-11-10 ENCOUNTER — Ambulatory Visit (HOSPITAL_PSYCHIATRIC): Payer: Medicare Other

## 2022-11-25 ENCOUNTER — Ambulatory Visit: Payer: Medicare Other | Attending: FAMILY PRACTICE | Admitting: FAMILY PRACTICE

## 2022-11-25 DIAGNOSIS — N390 Urinary tract infection, site not specified: Secondary | ICD-10-CM | POA: Insufficient documentation

## 2022-11-27 LAB — URINE CULTURE: URINE CULTURE: 5000 — AB

## 2022-12-16 ENCOUNTER — Other Ambulatory Visit: Payer: Self-pay

## 2022-12-16 ENCOUNTER — Ambulatory Visit
Admission: RE | Admit: 2022-12-16 | Discharge: 2022-12-16 | Disposition: A | Discharge status: Home / Self Care | Payer: Medicare Other | Source: Ambulatory Visit

## 2022-12-16 DIAGNOSIS — N281 Cyst of kidney, acquired: Secondary | ICD-10-CM | POA: Insufficient documentation

## 2023-03-17 ENCOUNTER — Other Ambulatory Visit (INDEPENDENT_AMBULATORY_CARE_PROVIDER_SITE_OTHER): Payer: Self-pay | Admitting: PHYSICIAN ASSISTANT

## 2023-03-17 MED ORDER — PANTOPRAZOLE 40 MG TABLET,DELAYED RELEASE
40.0000 mg | DELAYED_RELEASE_TABLET | Freq: Every day | ORAL | 0 refills | Status: DC
Start: 2023-03-17 — End: 2023-04-10

## 2023-03-30 ENCOUNTER — Encounter (INDEPENDENT_AMBULATORY_CARE_PROVIDER_SITE_OTHER): Payer: Self-pay | Admitting: PHYSICIAN ASSISTANT

## 2023-04-10 ENCOUNTER — Other Ambulatory Visit: Payer: Self-pay

## 2023-04-10 ENCOUNTER — Ambulatory Visit (INDEPENDENT_AMBULATORY_CARE_PROVIDER_SITE_OTHER): Payer: Medicare Other | Admitting: PHYSICIAN ASSISTANT

## 2023-04-10 ENCOUNTER — Encounter (INDEPENDENT_AMBULATORY_CARE_PROVIDER_SITE_OTHER): Payer: Self-pay | Admitting: PHYSICIAN ASSISTANT

## 2023-04-10 VITALS — BP 127/66 | HR 72 | Temp 97.2°F | Ht 66.0 in | Wt 182.4 lb

## 2023-04-10 DIAGNOSIS — N393 Stress incontinence (female) (male): Secondary | ICD-10-CM

## 2023-04-10 DIAGNOSIS — E612 Magnesium deficiency: Secondary | ICD-10-CM

## 2023-04-10 DIAGNOSIS — Z9089 Acquired absence of other organs: Secondary | ICD-10-CM

## 2023-04-10 DIAGNOSIS — Z1331 Encounter for screening for depression: Secondary | ICD-10-CM

## 2023-04-10 DIAGNOSIS — R143 Flatulence: Secondary | ICD-10-CM

## 2023-04-10 DIAGNOSIS — R142 Eructation: Secondary | ICD-10-CM

## 2023-04-10 DIAGNOSIS — K219 Gastro-esophageal reflux disease without esophagitis: Secondary | ICD-10-CM

## 2023-04-10 DIAGNOSIS — R519 Headache, unspecified: Secondary | ICD-10-CM

## 2023-04-10 DIAGNOSIS — M542 Cervicalgia: Secondary | ICD-10-CM

## 2023-04-10 DIAGNOSIS — R109 Unspecified abdominal pain: Secondary | ICD-10-CM

## 2023-04-10 DIAGNOSIS — R252 Cramp and spasm: Secondary | ICD-10-CM

## 2023-04-10 DIAGNOSIS — M47812 Spondylosis without myelopathy or radiculopathy, cervical region: Secondary | ICD-10-CM

## 2023-04-10 DIAGNOSIS — E785 Hyperlipidemia, unspecified: Secondary | ICD-10-CM

## 2023-04-10 DIAGNOSIS — Z9889 Other specified postprocedural states: Secondary | ICD-10-CM

## 2023-04-10 DIAGNOSIS — E559 Vitamin D deficiency, unspecified: Secondary | ICD-10-CM

## 2023-04-10 LAB — POCT URINE DIPSTICK
BILIRUBIN: NEGATIVE
BLOOD: NEGATIVE
GLUCOSE: NEGATIVE
KETONE: NEGATIVE
LEUKOCYTES: NEGATIVE
NITRITE: NEGATIVE
PH: 6.5
PROTEIN: NEGATIVE
SPECIFIC GRAVITY: 1.015
UROBILINOGEN: 0.2

## 2023-04-10 MED ORDER — PRAVASTATIN 20 MG TABLET
20.0000 mg | ORAL_TABLET | Freq: Every day | ORAL | 0 refills | Status: DC
Start: 2023-04-10 — End: 2023-07-20

## 2023-04-10 MED ORDER — CHOLECALCIFEROL (VITAMIN D3) 50 MCG (2,000 UNIT) TABLET
2000.0000 [IU] | ORAL_TABLET | Freq: Every day | ORAL | 0 refills | Status: AC
Start: 2023-04-10 — End: 2023-07-09

## 2023-04-10 MED ORDER — PANTOPRAZOLE 40 MG TABLET,DELAYED RELEASE
40.0000 mg | DELAYED_RELEASE_TABLET | Freq: Every day | ORAL | 0 refills | Status: DC
Start: 2023-04-10 — End: 2023-07-20

## 2023-04-10 MED ORDER — ASPIRIN 81 MG TABLET,DELAYED RELEASE
81.0000 mg | DELAYED_RELEASE_TABLET | Freq: Every day | ORAL | 0 refills | Status: AC
Start: 2023-04-10 — End: 2023-07-09

## 2023-04-10 MED ORDER — MAGNESIUM 100 MG (AS GLYCINATE) TABLET
400.0000 mg | ORAL_TABLET | Freq: Every day | ORAL | 0 refills | Status: AC
Start: 2023-04-10 — End: ?

## 2023-04-10 NOTE — Nursing Note (Signed)
04/10/23 1326   Recent Weight Change   Have you had a recent unexplained weight loss or gain? N   Domestic Violence   Because we are aware of abuse and domestic violence today, we ask all patients: Are you being hurt, hit, or frightened by anyone at your home or in your life?  N   Basic Needs   Do you have any basic needs within your home that are not being met? (such as Food, Shelter, Civil Service fast streamer, Tranportation, paying for bills and/or medications) N   Advanced Directives   Do you have any advanced directives? Living Will & MPOA   Do you have the Advanced Directive(s) so we can scan them to your chart? Jeannie Fend

## 2023-04-10 NOTE — Nursing Note (Signed)
04/10/23 1500   Urine test  (S[employer] Multistix 10 SG)   Performed Status: Manual   Time collected 1502   Color (Ref Range: Yellow) Yellow   Clarity (Ref Range: Clear) Clear   Glucose (Ref Range: Negative mg/dL) Negative   Bilirubin (Ref Range: Negative mg/dL) Negative   Ketones (Ref Range: Negative mg/dL) Negative   Urine Specific Gravity (Ref Range: 1.005 - 1.030) 1.015   Blood (urine) (Ref Range: Negative mg/dL) Negative   pH (Ref Range: 5.0 - 8.0) 6.5   Protein (Ref Range: Negative mg/dL) Negative   Urobilinogen (Ref Range: Normal) 0.2mg /dL (Normal)   Nitrite (Ref Range: Negative) Negative   Leukocytes (Ref Range: Negative WBC's/uL) Negative   Micro Sent no   Culture Sent no   Initials JB

## 2023-04-10 NOTE — Nursing Note (Signed)
04/10/23 1326   PHQ 9 (follow up)   Little interest or pleasure in doing things. 2   Feeling down, depressed, or hopeless 2   Trouble falling or staying asleep, or sleeping too much. 0   Feeling tired or having little energy 1   Poor appetite or overeating 0   Feeling bad about yourself/ that you are a failure in the past 2 weeks? 0   Trouble concentrating on things in the past 2 weeks? 0   Moving/Speaking slowly or being fidgety or restless  in the past 2 weeks? 0   Thoughts that you would be better off DEAD, or of hurting yourself in some way. 0   If you checked off any problems, how difficult have these problems made it for you to do your work, take care of things at home, or get along with other people? Not difficult at all   PHQ 9 Total 5   Interpretation of Total Score Mild depression

## 2023-04-10 NOTE — Progress Notes (Unsigned)
FAMILY MEDICINE, MEDICAL OFFICE BUILDING  [address]  [place] [address]      Progress Note    Bonnie Kline  23-Feb-1951  [id number]    Date of Service: 04/10/2023  1:30 PM EST    Chief complaint:   Chief Complaint   Patient presents with    Follow Up     Has been having trouble with her neck.        Subjective:     This is a case of a 72 y.o. year old female who comes in today for follow up. Pt.has been having problems with her right side of neck and goes up into right occipital area and then feels and hears "popping" with moving neck. No paresthesia or arm pain. Pts.had xrays of neck in the past showing DDD. Pt.also notices hand pain and they draw at times. Pt.had parathyroidectomy and feels much in regards to her sx. Pt.having GI issues, no medicine changes.  Pt.c/o recurrent right sided neck and head pain and pain and "pops" at times, ongoing since  summer..  Review of Systems   Constitutional:  Negative for activity change, chills, fatigue and fever.   HENT:  Negative for congestion, hearing loss, nosebleeds, postnasal drip and sinus pressure.    Respiratory: Negative.     Cardiovascular: Negative.    Gastrointestinal:  Negative for abdominal pain, constipation, diarrhea, nausea and vomiting.        Pt.notices increased in belching for months throughout day and notices flatulence episodes have increased. No reflux or regurg noticed    Neurological:  Negative for dizziness, light-headedness and headaches.      Current Outpatient Medications   Medication Sig    aspirin (ECOTRIN) 81 mg Oral Tablet, Delayed Release (E.C.) Take 1 Tablet (81 mg total) by mouth Once a day for 90 days    buPROPion (WELLBUTRIN) 100 mg Oral Tablet Take 1 Tablet (100 mg total) by mouth Twice daily    cholecalciferol, vitamin D3, (VITAMIN D-3) 50 mcg (2,000 unit) Oral Tablet Take 1 Tablet (2,000 Units total) by mouth Once a day for 90 days    diosmin complex no.1 (VASCULERA) 630 mg Oral Tablet Take 1 Tablet (630 mg total) by  mouth Once a day    FLUoxetine (PROZAC) 20 mg Oral Capsule Take 1 Capsule (20 mg total) by mouth Once a day Take with 40 mg pill for total dose of 60 mg every morning    FLUoxetine (PROZAC) 40 mg Oral Capsule Take 1 Capsule (40 mg total) by mouth Once a day    latanoprost (XALATAN) 0.005 % Ophthalmic Drops Instill 1 Drop into both eyes Once a day    Magnesium Glycinate 100 mg Oral Tablet Take 4 Tablets (400 mg total) by mouth Once a day    multivitamin-minerals-lutein (MULTIVITAMIN 50 PLUS) Oral Tablet Take 1 Tablet by mouth Once a day    pantoprazole (PROTONIX) 40 mg Oral Tablet, Delayed Release (E.C.) Take 1 Tablet (40 mg total) by mouth Once a day    pravastatin (PRAVACHOL) 20 mg Oral Tablet Take 1 Tablet (20 mg total) by mouth Once a day    SIMBRINZA 1-0.2 % Ophthalmic Drops, Suspension Instill 1 Drop into both eyes Twice daily    solifenacin (VESICARE) 10 mg Oral Tablet Take 1 Tablet (10 mg total) by mouth Once a day       Objective:     BP 127/66   Pulse 72   Temp 36.2 C (97.2 F) (Temporal)  Ht 1.676 m (5\' 6" )   Wt 82.7 kg (182 lb 6.4 oz)   SpO2 91%   BMI 29.44 kg/m     \   Physical Exam      Assessment & Plan  Magnesium deficiency    Orders:    Magnesium Glycinate 100 mg Oral Tablet; Take 4 Tablets (400 mg total) by mouth Once a day    MAGNESIUM; Future    Hand cramps    Orders:    CBC/DIFF; Future    Generalized headaches    Orders:    CBC/DIFF; Future    Burping    Orders:    XR KUB AND UPRIGHT ABDOMEN; Future    Flatulence    Orders:    XR KUB AND UPRIGHT ABDOMEN; Future    Neck pain on right side    Orders:    Referral to PHYSICAL THERAPY - [place]; Future    H/O parathyroidectomy    Orders:    aspirin (ECOTRIN) 81 mg Oral Tablet, Delayed Release (E.C.); Take 1 Tablet (81 mg total) by mouth Once a day for 90 days    Stress incontinence, female  Continue meds as directed       Gastroesophageal reflux disease without esophagitis    Orders:    pantoprazole (PROTONIX) 40 mg Oral Tablet, Delayed  Release (E.C.); Take 1 Tablet (40 mg total) by mouth Once a day    Hyperlipidemia, unspecified hyperlipidemia type  Check labs  Orders:    pravastatin (PRAVACHOL) 20 mg Oral Tablet; Take 1 Tablet (20 mg total) by mouth Once a day    Vitamin D deficiency    Orders:    cholecalciferol, vitamin D3, (VITAMIN D-3) 50 mcg (2,000 unit) Oral Tablet; Take 1 Tablet (2,000 Units total) by mouth Once a day for 90 days    Cervical spine degeneration    Orders:    Referral to PHYSICAL THERAPY - [place]; Future    Abdominal pain, unspecified abdominal location    Orders:    POCT Urine Dipstick                  The patient was given the opportunity to ask questions and those questions were answered to the patient's satisfaction. The patient was encouraged to call with any additional questions or concerns.    Follow up: No follow-ups on file.    Bonnie Shorter, PA-C

## 2023-04-13 ENCOUNTER — Other Ambulatory Visit: Payer: Self-pay

## 2023-04-13 ENCOUNTER — Ambulatory Visit (HOSPITAL_COMMUNITY): Payer: Medicare Other

## 2023-04-13 ENCOUNTER — Ambulatory Visit
Admission: RE | Admit: 2023-04-13 | Discharge: 2023-04-13 | Disposition: A | Discharge status: Home / Self Care | Payer: Medicare Other | Source: Ambulatory Visit | Attending: PHYSICIAN ASSISTANT | Admitting: PHYSICIAN ASSISTANT

## 2023-04-13 DIAGNOSIS — R142 Eructation: Secondary | ICD-10-CM | POA: Insufficient documentation

## 2023-04-13 DIAGNOSIS — R519 Headache, unspecified: Secondary | ICD-10-CM

## 2023-04-13 DIAGNOSIS — E612 Magnesium deficiency: Secondary | ICD-10-CM | POA: Insufficient documentation

## 2023-04-13 DIAGNOSIS — R143 Flatulence: Secondary | ICD-10-CM | POA: Insufficient documentation

## 2023-04-13 DIAGNOSIS — R252 Cramp and spasm: Secondary | ICD-10-CM

## 2023-04-13 LAB — CBC WITH DIFF
BASOPHIL #: 0 10*3/uL (ref 0.00–0.10)
BASOPHIL %: 1 % (ref 0–1)
EOSINOPHIL #: 0.3 10*3/uL (ref 0.00–0.50)
EOSINOPHIL %: 5 % (ref 1–7)
HCT: 40.1 % (ref 31.2–41.9)
HGB: 13.3 g/dL (ref 10.9–14.3)
LYMPHOCYTE #: 1.6 10*3/uL (ref 1.00–3.00)
LYMPHOCYTE %: 29 % (ref 16–44)
MCH: 28.9 pg (ref 24.7–32.8)
MCHC: 33.1 g/dL (ref 32.3–35.6)
MCV: 87.3 fL (ref 75.5–95.3)
MONOCYTE #: 0.4 10*3/uL (ref 0.30–1.00)
MONOCYTE %: 7 % (ref 5–13)
MPV: 9.1 fL (ref 7.9–10.8)
NEUTROPHIL #: 3.4 10*3/uL (ref 1.85–7.80)
NEUTROPHIL %: 59 % (ref 43–77)
PLATELETS: 230 10*3/uL (ref 140–440)
RBC: 4.59 10*6/uL (ref 3.63–4.92)
RDW: 14.2 % (ref 12.3–17.7)
WBC: 5.7 10*3/uL (ref 3.8–11.8)

## 2023-04-13 LAB — MAGNESIUM: MAGNESIUM: 1.9 mg/dL (ref 1.9–2.7)

## 2023-04-17 ENCOUNTER — Encounter (INDEPENDENT_AMBULATORY_CARE_PROVIDER_SITE_OTHER): Payer: Self-pay | Admitting: PHYSICIAN ASSISTANT

## 2023-04-17 ENCOUNTER — Ambulatory Visit (INDEPENDENT_AMBULATORY_CARE_PROVIDER_SITE_OTHER): Payer: Self-pay | Admitting: PHYSICIAN ASSISTANT

## 2023-04-30 ENCOUNTER — Other Ambulatory Visit: Payer: Self-pay

## 2023-04-30 ENCOUNTER — Ambulatory Visit
Admission: RE | Admit: 2023-04-30 | Discharge: 2023-04-30 | Disposition: A | Discharge status: Still In Hospital | Payer: Medicare Other | Source: Ambulatory Visit | Attending: PHYSICIAN ASSISTANT | Admitting: PHYSICIAN ASSISTANT

## 2023-04-30 ENCOUNTER — Encounter (HOSPITAL_COMMUNITY): Payer: Self-pay

## 2023-04-30 DIAGNOSIS — M47812 Spondylosis without myelopathy or radiculopathy, cervical region: Secondary | ICD-10-CM | POA: Insufficient documentation

## 2023-04-30 DIAGNOSIS — M542 Cervicalgia: Secondary | ICD-10-CM

## 2023-04-30 NOTE — PT Evaluation (Signed)
[facility] Medicine [facility]  Outpatient Physical Therapy  [address]  [place], [address]  [phone]  (Fax) [phone]      Physical Therapy Cervical Evaluation    Date: 04/30/2023  Patient's Name: Bonnie Kline  Date of Birth: 30-May-1950  Physical Therapy Evaluation     Evaluating Physical Therapist: Lunette Stands PT   PT diagnosis/Reason for Referral: CERVICAL PAIN SECONDARY TO DDD/DJD  Next Scheduled Physician Appointment: none scheduled  Allergies/Contraindications: none      SUBJECTIVE  Date of onset: approximately 3 weeks ago . Pt woke with a crick in her neck     Mechanism of injury: none    Current Presentation: pt reports a little improvement since onset     PLOF: neck stiffness intermittently,     Past Medical History:   Past Medical History:   Diagnosis Date    Allergies     Anxiety     Asymptomatic varicose veins     B12 deficiency     Bilateral carotid bruits     Bulging lumbar disc     Dementia (CMS HCC)     Depression     Esophageal reflux     Hiatal hernia     History of colon cancer 2015    History of colonic polyps     Pre-Cancerous    HLD (hyperlipidemia)     Hypercalcemia     Hyperparathyroidism (CMS HCC)     Hypertension     Low magnesium level     Post-menopausal     Renal cyst     RLS (restless legs syndrome)     Sleep apnea     SVT (supraventricular tachycardia) (CMS HCC)     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)     Unspecified urinary incontinence     Vitamin D deficiency          Past Surgical History:   Past Surgical History:   Procedure Laterality Date    COLONOSCOPY      ESOPHAGOGASTRODUODENOSCOPY      HX CARDIAC ABLATION  2013    HX CARPAL TUNNEL RELEASE Left 2010    HX COLON SURGERY (ANY)      Colon resection- due to colon cancer    HX TAH AND BSO  1998    ORTHOPEDIC SURGERY      right ankle       Previous episodes/treatments: therapy several years ago for a similar problem     Medications for this problem: anti-inflammatory    Diagnostic tests:  MRI RESULTS PER  EMR : " C3-4:  Mild left-sided uncovertebral arthrosis results in mild left foraminal narrowing.   C4-5:  Disc bulge with mild uncovertebral arthrosis. Mild canal and left foraminal narrowing.   C5-6:  Disc bulge with uncovertebral arthrosis. Mild canal and moderate bilateral foraminal narrowing, right greater than left.   C6-7:  Posterior disc osteophyte complex with uncovertebral arthrosis. There is mild to moderate canal and left greater than right foraminal narrowing.   C7-T1: Unremarkable."       Patient goals: REDUCE PAIN and NORMALIZE FUNCTION    Occupation:  retired    Next MD visit: tbd    Pain location: right lateral neck pain  worse than left lateral neck                     Pain description: SHARP, DULL, and ACHING    Pain frequency:  INTERMITTENT  and  daily    Pain  rating: Now 6  Best 0   Worst 10 ( with right rotation )    Radiculopathy: no     Pain increases with:  right rotation, cervical flexion , extension , end  range left rotation            decreases with : MASSAGE and avoiding those motions , aleve    Sensation: pt denies altered sensation    Weakness: pt notes  chronic weak grip     Sleep affected: no, uses CPAP; wakes with stiffness    Headaches: no    Dizziness: no    PLOF: stiff neck but no pain     Subjective Functional Reports:    Sitting: WFL    Standing: WFL    Walking: WFL    Lifting: LIMITED                OBJECTIVE    ROM   right left   rotation 35 40   sidebend 10 5 degrees pulls right   flexion 60 -------------------------   extension 6 -------------------------       Strength  Grip strength with JAMAR postion 2 :   left =  50lbs         right  = 45 lbs     right left   Elbow flexion (C6) 4/5 MMT 4/5   Elbow extension (C7) 4/5 4/5   Wrist extension (C6) 4/5 4/5   Wrist flexion (C7) 5/5 5/5   Thumb extension (C8) 4/5 4/5   Finger abduction (T1) 5/5 5/5     Palpation: tender mid to lower right cervical facets and posterior scalene, UT     Posture:  Straightening of cervical lordosis  ,  forward shoulders, right more than left, Dowager's hump,     Special tests : pain with right lower facet compression;     Treatment provided: evaluation, NWB MFR and limited facet mobilization,  and [place] ( 1.4 w/cm2, x 9 min to upper, mid and lower cervical facets right      ASSESSMENT    Impression: the pt has poor segmental and gross cervical mobility , she has a major loss of motion in transverse and frontal planes. She has multilevel facet arthrosis and disc bulging per MRI report in EMR. Physical therapy impression is that of facet dysfunction. This patient has had good results with therapy several years ago for a similar problem.     Rehab potential: FAIR    Goals:    Short-Term Goals: (3 Weeks):   - Patient will demonstrate improved cervical AROM by  at least 10 degrees in transverse and frontal planes  to aid in completion of ADLs.   -  Patient will demonstrate independence with progressive HEP to maximize gains from PT.    - Patient will report max SPS rating below 8/10 pain to aid in completion of ADLs duties.           Long-Term Goals: (6 Weeks):     - Patient will demonstrate improved cervical AROM to to [facility] with non painful end feel to aid in completion of ADLs.   -  Patient will report max 4/10 pain to aid in completion of ADLs/work duties.   - the pt will  have segmental/gross ROM  and strength WFL to permit  normal cervical thoracic disassociation with active mobility           PLAN  Patient will attend 2 times per week x 6 weeks. Therapy may  include, but is not limited to THERAPEUTIC EXERCISES, MYOFASCIAL/JOINT MOBILIZATION, POSTURE/BODY MECHANICS, ERGONOMIC TRAINING, HOME INSTRUCTIONS, ULTRASOUND, and KINESIOTAPE    Plan for next visit: repeat US and NWB passive mobilization as tolerated. Start HEP of  NWB ROM exercises.        Evaluation complexity:   Personal factors impacting POC: PRE-EXISTING FUNCTIONAL LIMITATIONS   Co-morbidities impacting POC: COGNITIVE DEFICIT, PSYCHIATRIC DIAGNOSIS, and  CARDIAC DISEASE  Complexity of physical exam: INCLUDING MUSCULOSKELETAL SYSTEM (POSTURE, ROM, STRENGTH, HEIGHT/WEIGHT) and INCLUDING ACTIVITY/MOBILITY RESTRICTIONS   Clinical Presentation: STABLE   Evaluation Complexity: LOW-HISTORY 0, EXAMINATION 1-2, STABLE PRESENTATION        Total Session Time 47 and Timed code minutes 29        Intervention minutes: EVALUATION 20 minutes,  ULTRASOUND , and JOINT MOBILIZATION/MFR 18 minutes    Lunette Stands, PT  04/30/2023, 1115                Certification:    From:______  Through:_________    I certify the need for these services furnished under this plan of treatment and while under my care.    Referring Provider Signature: _______________     Date : _____________________

## 2023-05-07 ENCOUNTER — Ambulatory Visit (HOSPITAL_COMMUNITY): Payer: Self-pay

## 2023-05-08 ENCOUNTER — Ambulatory Visit (HOSPITAL_COMMUNITY)
Admission: RE | Admit: 2023-05-08 | Discharge: 2023-05-08 | Disposition: A | Discharge status: Still In Hospital | Payer: Medicare Other | Source: Ambulatory Visit | Attending: PHYSICIAN ASSISTANT | Admitting: PHYSICIAN ASSISTANT

## 2023-05-08 NOTE — PT Treatment (Signed)
[facility] Medicine [facility]  Outpatient Physical Therapy  [address]  [place], [address]  [phone]  (Fax) [phone]    Physical Therapy Treatment Note    Date: 05/08/2023  Patient's Name: Bonnie Kline  Date of Birth: 02-14-1951  Physical Therapy Visit            Visit #/POC:2/12  Authorization:  20cy  POC Signed?:  no  POC Ends:  2 / 20/25  Order Ends: open  Next Progress Note Due:  10 visits or 2 / 2/ 25    Evaluating Physical Therapist: Lunette Stands PT   PT diagnosis/Reason for Referral: CERVICAL PAIN SECONDARY TO DDD/DJD  Next Scheduled Physician Appointment: none scheduled  Allergies/Contraindications: none             Subjective:  Pt states she has changed out her pillows and is using massager and creams at home.  States she is sleeping better since she changed out her pillows.  Notes her first treatment did provide relief for that day.      Objective:  activities as noted below.      Measured ROM:   EXERCISE/ACTIVITY NAME REPETITIONS RESISTANCE COMPLETED THIS DOS    [place] to paraspinals, UT    9 min; 1.0 MHz, 1.5 W/cm2   Yes     Manual therapies:  Gentle cervical traction  Manual ROM in supine  Upper trap stretch  Scalene release  Anterior chest stretch manual       Yes  Yes  Yes  Yes  Yes     Seated scapular retraction   Seated cervical retraction   5 sec x 10  5 sec x 10  Yes  yes                                                         Assessment:  Pt tolerated well and noted good relief after session.  Pt is responding well to [place] and manual treatments.  She does exhibit limited ROM more R vs L.  Hypertonic UT bilaterally    Short-Term Goals: (3 Weeks):   - Patient will demonstrate improved cervical AROM by  at least 10 degrees in transverse and frontal planes  to aid in completion of ADLs.   -  Patient will demonstrate independence with progressive HEP to maximize gains from PT.    - Patient will report max SPS rating below 8/10 pain to aid in completion of ADLs duties.            Long-Term Goals: (6 Weeks):     - Patient will demonstrate improved cervical AROM to to [facility] with non painful end feel to aid in completion of ADLs.   -  Patient will report max 4/10 pain to aid in completion of ADLs/work duties.   - the pt will  have segmental/gross ROM  and strength WFL to permit  normal cervical thoracic disassociation with active mobility       Plan:  Will monitor response to treatment and progress as tolerated     Total Session Time 38 and Timed code minutes 38   ULTRASOUND and JOINT MOBILIZATION/MFR 29 minutes      Tyre Beaver, PTA  05/08/2023, 09:25

## 2023-05-12 ENCOUNTER — Ambulatory Visit (HOSPITAL_COMMUNITY)
Admission: RE | Admit: 2023-05-12 | Discharge: 2023-05-12 | Disposition: A | Discharge status: Still In Hospital | Payer: Self-pay | Source: Ambulatory Visit | Attending: PHYSICIAN ASSISTANT | Admitting: PHYSICIAN ASSISTANT

## 2023-05-12 NOTE — PT Treatment (Signed)
[facility] Medicine [facility]  Outpatient Physical Therapy  [address]  [place], [address]  [phone]  (Fax) [phone]    Physical Therapy Treatment Note    Date: 05/12/2023  Patient's Name: Bonnie Kline  Date of Birth: 02/26/1951  Physical Therapy Visit        Visit #/POC:3/12  Authorization:  20cy  POC Signed?:  no  POC Ends:  2 / 20/25  Order Ends: open  Next Progress Note Due:  10 visits or 2 / 2/ 25     Evaluating Physical Therapist: Lunette Stands PT   PT diagnosis/Reason for Referral: CERVICAL PAIN SECONDARY TO DDD/DJD  Next Scheduled Physician Appointment: none scheduled  Allergies/Contraindications: none            Subjective: Pt reports she was doing well over weekend. Reports last treatment provided relief for several days.  States she had to clean bathroom and may have over worked.  States pain past 2 days has been worse.  Rates pain today 8/10.     Objective:  Activities as noted below.      Measured ROM:      EXERCISE/ACTIVITY NAME REPETITIONS RESISTANCE COMPLETED THIS DOS    [place] to paraspinals, UT     9 min; 1.0 MHz, 1.5 W/cm2    Yes     Manual therapies:  Gentle cervical traction  Manual ROM in supine  Upper trap stretch  Scalene release  Anterior chest stretch manual           Yes  Yes  Yes  Yes  Yes     Seated scapular retraction   Seated cervical retraction    5 sec x 10  5 sec x 10   Yes  yes                                                                                        Assessment: Pt reports good relief after session.  Pain reduced to 4/10.  Still some difficulty with ROM however tight painful feeling improved.  Pt is more aware of posture however any heavy activity flares pain     Short-Term Goals: (3 Weeks):   - Patient will demonstrate improved cervical AROM by at least 10 degrees in transverse and frontal planes to aid in completion of ADLs.   - Patient will demonstrate independence with progressive HEP to maximize gains from PT.   - Patient will report  max SPS rating below 8/10 pain to aid in completion of ADLs duties.         Long-Term Goals: (6 Weeks):     - Patient will demonstrate improved cervical AROM to to [facility] with non painful end feel to aid in completion of ADLs.   - Patient will report max 4/10 pain to aid in completion of ADLs/work duties.   - the pt will have segmental/gross ROM and strength WFL to permit normal cervical thoracic disassociation with active mobility       Plan:  Will continue and progress to more active program as tolerated.  Assess facet mobility next visit     Total Session Time 30 and  Timed code minutes 30   ULTRASOUND and JOINT MOBILIZATION/MFR 21 minutes      Margurite Duffy, PTA  05/12/2023, 09:29

## 2023-05-14 ENCOUNTER — Ambulatory Visit (HOSPITAL_COMMUNITY)
Admission: RE | Admit: 2023-05-14 | Discharge: 2023-05-14 | Disposition: A | Discharge status: Still In Hospital | Payer: Medicare Other | Source: Ambulatory Visit | Attending: PHYSICIAN ASSISTANT | Admitting: PHYSICIAN ASSISTANT

## 2023-05-14 ENCOUNTER — Other Ambulatory Visit: Payer: Self-pay

## 2023-05-14 NOTE — PT Treatment (Signed)
[facility] Medicine [facility]  Outpatient Physical Therapy  [address]  [place], [address]  [phone]  (Fax) [phone]    Physical Therapy Treatment Note    Date: 05/14/2023  Patient's Name: Bonnie Kline  Date of Birth: 21-Feb-1951  Physical Therapy Visit    Visit #/POC: 4/12  Authorization: 4 / 20cy  POC Signed?:  no  POC Ends:  2 / 20/25  Order Ends: open  Next Progress Note Due:  10 visits or 2 / 2/ 25     Evaluating Physical Therapist: Lunette Stands PT   PT diagnosis/Reason for Referral: CERVICAL PAIN SECONDARY TO DDD/DJD  Next Scheduled Physician Appointment: none scheduled  Allergies/Contraindications: none         Subjective:  she has tried to modify her sleeping position     Objective:  Activities as noted below.       Measured ROM:  40 degrees rotation left, 45 degrees rotation right(was 35 degrees)  , 65 degrees flexion ( was 60 degrees )     EXERCISE/ACTIVITY NAME REPETITIONS RESISTANCE COMPLETED THIS DOS    [place] to paraspinals, UT     9 min; 1.0 MHz, 1.5 W/cm2    no    Manual therapies:  Gentle cervical traction  Manual ROM in supine  Upper trap stretch  Scalene release  Anterior chest stretch manual             Mod overpressure, static holds 30 sec      Yes  Yes  Yes  Yes  Yes     Seated scapular retraction   Seated cervical retraction  5 sec x 10  5 sec x 10   Yes  yes    positional static anterior chest release in supine hook lying    during session     yes, added to HEP 05/14/23                                                            pt education: use of wedge seat cushion and lumbar roll to correct sitting posture.    Access Code: [email or web address]  URL: [email or web address]  Date: 05/14/2023  Prepared by: Lunette Stands    Exercises  - Supine Thoracic Mobilization Towel Roll Horizontal  - 2 x daily - 7 x weekly - 1 sets - 1 reps - 5 - 10 minustes hold     Assessment: Pt reports good relief after session.  Pain reduced to 4/10.  Still some difficulty with ROM however  tight painful feeling improved.  Pt is more aware of posture however any heavy activity flares pain      Short-Term Goals: (3 Weeks):   - Patient will demonstrate improved cervical AROM by at least 10 degrees in transverse and frontal planes to aid in completion of ADLs.   - Patient will demonstrate independence with progressive HEP to maximize gains from PT.   - Patient will report max SPS rating below 8/10 pain to aid in completion of ADLs duties.         Long-Term Goals: (6 Weeks):     - Patient will demonstrate improved cervical AROM to to [facility] with non painful end feel to aid in completion of ADLs.   - Patient will report max 4/10 pain to  aid in completion of ADLs/work duties.   - the pt will have segmental/gross ROM and strength WFL to permit normal cervical thoracic disassociation with active mobility           Plan: continue with postural exercises both active and passive, and thoracic mobility exercises to reduce kyphosis    Total Session Time 41 and Timed code minutes 41  JOINT MOBILIZATION/MFR 30 minutes and ADL/IADL TRAINING 11 minutes      Lunette Stands, PT  05/14/2023, 0930

## 2023-05-19 ENCOUNTER — Other Ambulatory Visit: Payer: Self-pay

## 2023-05-19 ENCOUNTER — Ambulatory Visit (HOSPITAL_COMMUNITY)
Admission: RE | Admit: 2023-05-19 | Discharge: 2023-05-19 | Disposition: A | Discharge status: Still In Hospital | Payer: Medicare Other | Source: Ambulatory Visit | Attending: PHYSICIAN ASSISTANT | Admitting: PHYSICIAN ASSISTANT

## 2023-05-19 NOTE — PT Treatment (Signed)
[facility] Medicine [facility]  Outpatient Physical Therapy  [address]  [place], [address]  [phone]  (Fax) [phone]    Physical Therapy Treatment Note    Date: 05/19/2023  Patient's Name: Bonnie Kline  Date of Birth: Sep 20, 1950  Physical Therapy Visit    Visit #/POC: 5/12  Authorization: 5 / 20cy  POC Signed?:  no  POC Ends:  2 / 20/25  Order Ends: open  Next Progress Note Due:  10 visits or 2 / 2/ 25     Evaluating Physical Therapist: Lunette Stands PT   PT diagnosis/Reason for Referral: CERVICAL PAIN SECONDARY TO DDD/DJD  Next Scheduled Physician Appointment: none scheduled  Allergies/Contraindications: none         Subjective:   her neck feels good . She does her stretching routine right before bedtime  and upon waking . Her neck feels looser. SPS today = 2. Worst SPS rating in past 48 hours = 6 - 7 level   with motion.   Objective:       Measured ROM:  45 degrees rotation left, 45 degrees rotation right(was 35 degrees)  , 65 degrees flexion ( was 60 degrees )     EXERCISE/ACTIVITY NAME REPETITIONS RESISTANCE COMPLETED THIS DOS    [place] to paraspinals, UT     9 min; 1.0 MHz, 1.5 W/cm2    no    Manual therapies:  Gentle cervical traction  Manual ROM in supine  Upper trap stretch  Scalene release  Anterior chest stretch manual   Passive axial extension cervical spine                Mod overpressure, static holds 30 sec      Yes  Yes  Yes  Yes  Yes   yes    Seated scapular retraction   Seated cervical retraction  5 sec x 10  5 sec x 10   NO  NO    positional static anterior chest release in supine hook lying    during session     NO, added to HEP 05/14/23     supine apophyseal glide ( upslides) with towel edge : rotation r/l   x 10 each    yes, added to HEP 05/19/23     mid and lower cervical segmental up slides and down slides  grade II     YES                                        Date: 05/19/2023  Prepared by: Lunette Stands    Exercises  - Seated Assisted Cervical Rotation with Towel   - 1 x daily - 7 x weekly - 3 sets - 10 reps     Assessment:  the pt's AROM is slowly improving and she is less acutely painful since [facility]. Today she was less guarded and was able to tolerate direct facet mobilization to right cervical facets.      Short-Term Goals: (3 Weeks):   - Patient will demonstrate improved cervical AROM by at least 10 degrees in transverse and frontal planes to aid in completion of ADLs.   - Patient will demonstrate independence with progressive HEP to maximize gains from PT.   - Patient will report max SPS rating below 8/10 pain to aid in completion of ADLs duties. ( Met 05/19/23)  Long-Term Goals: (6 Weeks):     - Patient will demonstrate improved cervical AROM to to [facility] with non painful end feel to aid in completion of ADLs.   - Patient will report max 4/10 pain to aid in completion of ADLs/work duties.   - the pt will have segmental/gross ROM and strength WFL to permit normal cervical thoracic disassociation with active mobility         Plan: continue with NWB  active and passive mobility exercises.     Total Session Time 49 and Timed code minutes 49  THERAPEUTIC EXERCISE 15 minutes and JOINT MOBILIZATION/MFR 34 minutes      Lunette Stands, PT  05/19/2023, 1130

## 2023-05-22 ENCOUNTER — Ambulatory Visit (HOSPITAL_COMMUNITY)
Admission: RE | Admit: 2023-05-22 | Discharge: 2023-05-22 | Disposition: A | Discharge status: Still In Hospital | Payer: Medicare Other | Source: Ambulatory Visit | Attending: PHYSICIAN ASSISTANT | Admitting: PHYSICIAN ASSISTANT

## 2023-05-22 NOTE — PT Treatment (Signed)
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  Notes she felt really good for several days but pain started to return yesterday.   Notes she is doing stretches and does feel like therapy is helping but today just a bad day.      Objective:  Trial of MHP before manual therapies.  Did not progress today d/t pain but did demonstrate cervical ROM with towel roll for support     Measured ROM:  measured last visit.   EXERCISE/ACTIVITY NAME REPETITIONS RESISTANCE COMPLETED THIS DOS    [place] to paraspinals, UT     9 min; 1.0 MHz, 1.5 W/cm2    no    Manual therapies:  Gentle cervical traction  Manual ROM in supine  Upper trap stretch  Scalene release  Anterior chest stretch manual   Passive axial extension cervical spine                Mod overpressure, static holds 30 sec      Yes  Yes  Yes  Yes  Yes   yes    Seated scapular retraction   Seated cervical retraction  5 sec x 10  5 sec x 10   NO  NO    positional static anterior chest release in supine hook lying    during session     NO, added to HEP 05/14/23     supine apophyseal glide ( upslides) with towel edge : rotation r/l   x 10 each    yes, added to HEP 05/19/23     mid and lower cervical segmental up slides and down slides  grade II     YES                                           Assessment:  Pt tolerated well.   Improved tolerance as treatment progressed.  Pt continues to have significant limitations in ROM especially to the R.      Short-Term Goals: (3 Weeks):   - Patient will demonstrate improved cervical AROM by at least 10 degrees in transverse and frontal planes to aid in completion of ADLs.   - Patient will demonstrate independence with progressive HEP to maximize gains from PT.   - Patient will report max SPS rating below 8/10 pain to aid in completion of ADLs duties. ( Met 05/19/23)        Long-Term Goals: (6 Weeks):     - Patient will demonstrate improved cervical AROM to to [facility] with non painful end feel to aid in completion of ADLs.   -  Patient will report max 4/10 pain to aid in completion of ADLs/work duties.   - the pt will have segmental/gross ROM and strength WFL to permit normal cervical thoracic disassociation with active mobility          Plan:   Will monitor and proceed accordingly    Total Session Time 40, Timed code minutes 30, and Untimed code minutes 10  JOINT MOBILIZATION/MFR 30 minutes      Drayden Lukas, PTA  05/22/2023, 10:19

## 2023-05-25 ENCOUNTER — Ambulatory Visit (HOSPITAL_COMMUNITY)
Admission: RE | Admit: 2023-05-25 | Discharge: 2023-05-25 | Disposition: A | Discharge status: Still In Hospital | Payer: Medicare Other | Source: Ambulatory Visit | Attending: PHYSICIAN ASSISTANT | Admitting: PHYSICIAN ASSISTANT

## 2023-05-25 ENCOUNTER — Other Ambulatory Visit: Payer: Self-pay

## 2023-05-25 NOTE — PT Treatment (Signed)
[facility] Medicine [facility]  Outpatient Physical Therapy  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 She reports pain continues to be mostly in right side of neck but does think she is slowly improving with therapy. She rates pain today 4/10.     Objective:  MHP X 5 minutes f/b activities as noted below:    Measured ROM:  measured last visit.   EXERCISE/ACTIVITY NAME REPETITIONS RESISTANCE COMPLETED THIS DOS    [place] to paraspinals, [place]     9 min; 1.0 MHz, 1.5 W/cm2    no   MHP X 5 MINUTES   Yes     Manual therapies:  Gentle cervical traction  Manual ROM in supine  Upper trap stretch  Scalene release  Anterior chest stretch manual   Passive axial extension cervical spine                Mod overpressure, static holds 30 sec      Yes  Yes  Yes  Yes  Yes   yes    Seated scapular retraction   Seated cervical retraction  5 sec x 10  5 sec x 10   NO  NO    positional static anterior chest release in supine hook lying    during session     NO, added to HEP 05/14/23     supine apophyseal glide ( upslides) with towel edge : rotation r/l   x 10 each    yes, added to HEP 05/19/23     mid and lower cervical segmental up slides and down slides  grade II     YES                                           Assessment:   Good response to MHP at start of session. Right cervical rotation  passive/active ROM improved post manual activities. Discussed use of towel roll under neck during supine ROM activities. Patient reported decreased pain at end of session however feeling of a catch on right side still present.     Short-Term Goals: (3 Weeks):   - Patient will demonstrate improved cervical AROM by at least 10 degrees in transverse and frontal planes to aid in completion of ADLs.   - Patient will demonstrate independence with progressive HEP to maximize gains from PT.   - Patient will report max SPS rating below 8/10 pain to aid in completion of ADLs duties. ( Met 05/19/23)        Long-Term Goals: (6 Weeks):     - Patient will demonstrate improved  cervical AROM to to [facility] with non painful end feel to aid in completion of ADLs.   - Patient will report max 4/10 pain to aid in completion of ADLs/work duties.   - the pt will have segmental/gross ROM and strength WFL to permit normal cervical thoracic disassociation with active mobility          Plan:   Will monitor and proceed accordingly.    Total Session Time 40, Timed code minutes 35, and Untimed code minutes 5  JOINT MOBILIZATION/MFR 35 minutes      [employer], PTA  05/25/2023 11:06

## 2023-05-27 ENCOUNTER — Ambulatory Visit
Admission: RE | Admit: 2023-05-27 | Discharge: 2023-05-27 | Disposition: A | Discharge status: Still In Hospital | Payer: Medicare Other | Source: Ambulatory Visit | Attending: PHYSICIAN ASSISTANT | Admitting: PHYSICIAN ASSISTANT

## 2023-05-27 NOTE — PT Treatment (Signed)
[facility] Medicine [facility]  Outpatient Physical Therapy  [address]  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  States R side continues to be painful but overall is getting some better.      Objective:  Activities as noted below.  Did start with 6 min of MHP during subjective.      Measured ROM:   EXERCISE/ACTIVITY NAME REPETITIONS RESISTANCE COMPLETED THIS DOS    [place] to paraspinals, [place]     9 min; 1.0 MHz, 1.5 W/cm2    no   MHP X 5 MINUTES     Yes     Manual therapies:  Gentle cervical traction  Manual ROM in supine  Upper trap stretch  Scalene release  Anterior chest stretch manual   Passive axial extension cervical spine                Mod overpressure, static holds 30 sec      Yes  Yes  Yes  Yes  Yes   yes    Seated scapular retraction   Seated cervical retraction  5 sec x 10  5 sec x 10   NO  NO    positional static anterior chest release in supine hook lying    during session     NO, added to HEP 05/14/23     supine apophyseal glide ( upslides) with towel edge : rotation r/l   x 10 each    yes, added to HEP 05/19/23     mid and lower cervical segmental up slides and down slides  grade II     YES     supine subcranial flexion/extension  over towel roll   10    yes                            Assessment: Pt tolerated well.  Continues to have significant  limitation in upper cervical mobility.  R more painful than L.  Pt is close to baseline since recent flare up of pain    Short-Term Goals: (3 Weeks):   - Patient will demonstrate improved cervical AROM by at least 10 degrees in transverse and frontal planes to aid in completion of ADLs.   - Patient will demonstrate independence with progressive HEP to maximize gains from PT.   - Patient will report max SPS rating below 8/10 pain to aid in completion of ADLs duties. ( Met 05/19/23)        Long-Term Goals: (6 Weeks):     - Patient will demonstrate improved cervical AROM to to [facility] with non painful end feel to aid in completion of ADLs.   -  Patient will report max 4/10 pain to aid in completion of ADLs/work duties.   - the pt will have segmental/gross ROM and strength WFL to permit normal cervical thoracic disassociation with active mobility          Plan:   PT will reassess next week.     Total Session Time 36, Timed code minutes 30, and Untimed code minutes 6  JOINT MOBILIZATION/MFR 30 minutes      Zea Kostka, PTA  05/27/2023, 10:13

## 2023-06-01 ENCOUNTER — Ambulatory Visit (HOSPITAL_COMMUNITY)
Admission: RE | Admit: 2023-06-01 | Discharge: 2023-06-01 | Disposition: A | Discharge status: Still In Hospital | Payer: Medicare Other | Source: Ambulatory Visit | Attending: PHYSICIAN ASSISTANT | Admitting: PHYSICIAN ASSISTANT

## 2023-06-01 ENCOUNTER — Other Ambulatory Visit: Payer: Self-pay

## 2023-06-01 DIAGNOSIS — M47812 Spondylosis without myelopathy or radiculopathy, cervical region: Secondary | ICD-10-CM | POA: Insufficient documentation

## 2023-06-01 DIAGNOSIS — M542 Cervicalgia: Secondary | ICD-10-CM | POA: Insufficient documentation

## 2023-06-01 NOTE — Progress Notes (Signed)
[facility] Medicine [facility]  Outpatient Physical Therapy  [address]  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 She felt that she  was doing better before this exacerbation. Worst SPS rating  = 9. Some days her neck pain is constant other days the pain is intermittent.      Objective: Today's session was focused on pain relief due to acuity of symptoms .     Measured ROM:  40 degrees left rotation ( pulls right ) , 40 degrees right rotation ( limited by right sided pain, was 35 degrees upon SOC ) , 65 degrees extension with left deviation , 45 degrees flexion  with right myofascial pull; 20 degrees right lateral flexion , 10 degrees left  lateral flexion .   EXERCISE/ACTIVITY NAME REPETITIONS RESISTANCE COMPLETED THIS DOS    [place] to paraspinals, [place]     8 min; 1.0 MHz, 1.3 w/cm2, intensity, 50% duty cycle    Yes, to right cervical facets   MHP X 5 MINUTES     no    Manual therapies:  Gentle cervical traction  Manual ROM in supine  Upper trap stretch  Scalene release  Anterior chest stretch manual   Passive axial extension cervical spine                Mod overpressure, static holds 30 sec      Yes  Yes  Yes  Yes ( right)   no  yes    Seated scapular retraction   Seated cervical retraction  5 sec x 10  5 sec x 10   NO  NO    positional static anterior chest release in supine hook  lying    during session     NO, added to HEP 05/14/23     supine apophyseal glide ( upslides) with towel edge : rotation r/l   x 10 each    yes, added to HEP 05/19/23     mid and lower cervical segmental up slides and down slides  grade II     YES     supine subcranial flexion/extension  over towel roll   10    no                            Assessment:  today the pt's AROM is unimproved from [facility] due to recent exacerbation. Her recent exacerbation is likely due to postural strain while sleeping with an unsupportive pillow.  Prior to an exacerbation of her symptoms she was demonstrating increase AROM and decrease acuity of pain and was on track to meet Medicare criteria to continue her care past initial 30 day cycle.  She can be reasonably expected to recover from this exacerbation and  to continue progressing toward goals of therapy . Today's session was focused on pain relief.     Short-Term Goals: (3 Weeks):   - Patient will demonstrate improved cervical AROM by at least 10 degrees in transverse and frontal planes to aid in completion of ADLs.   - Patient will demonstrate independence with progressive HEP to maximize gains from PT.   - Patient will report max SPS rating below 8/10 pain to aid in completion of ADLs duties. ( Was Met 05/19/23, not met now due to exacerbation this week )        Long-Term Goals: (6 Weeks):     - Patient will demonstrate improved cervical AROM to to [facility] with non painful end feel to aid in completion of ADLs.   - Patient will report max 4/10 pain to aid in completion of ADLs/work duties.   - the pt will have segmental/gross ROM and strength WFL to permit normal cervical thoracic disassociation with active mobility           Plan:  I will reassess the pt next session to see if she is having any functional improvement. If she remains unimproved I will refer her back to her PCP for reassessment. She might need injection therapy to improve her symptoms as she has advanced cervical DDD/DJD./      Total Session Time 50 and Timed code minutes 50   ULTRASOUND and JOINT MOBILIZATION/MFR 40 minutes      Lunette Stands, PT  06/01/2023, 1215

## 2023-06-04 ENCOUNTER — Other Ambulatory Visit: Payer: Self-pay

## 2023-06-04 ENCOUNTER — Ambulatory Visit (HOSPITAL_COMMUNITY)
Admission: RE | Admit: 2023-06-04 | Discharge: 2023-06-04 | Disposition: A | Discharge status: Still In Hospital | Payer: Medicare Other | Source: Ambulatory Visit | Attending: PHYSICIAN ASSISTANT | Admitting: PHYSICIAN ASSISTANT

## 2023-06-04 NOTE — PT Treatment (Signed)
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 She has use her buckwheat pillow  the last 2 nights and slept better and had less pain . SPS = 5   with movement  ( head turning) .      Objective:       Measured ROM:  45 degrees left rotation ( pulls right ) , 45 degrees right rotation ( limited by right sided pain, was 35 degrees upon SOC ) , 65 degrees extension with left deviation , 60 degrees flexion  with right myofascial pull; 15 degrees right lateral flexion , 5 degrees left  lateral flexion .   EXERCISE/ACTIVITY NAME REPETITIONS RESISTANCE COMPLETED THIS DOS    [place] to paraspinals, [place]     8 min; 1.0 MHz, 1.3 w/cm2, intensity, 50% duty cycle    Yes, to right cervical facets   MHP X 5 MINUTES     Yes,during session    Manual therapies:  Gentle cervical traction  Manual ROM in supine  Upper trap stretch  Scalene release  Anterior chest stretch manual   Passive axial extension cervical spine                Mod overpressure, static holds 30 sec      Yes  Yes  Yes  Yes ( right)   no  yes    Seated scapular retraction   Seated cervical retraction  5 sec x 10  5 sec x 10   NO  NO    positional static anterior chest release in supine hook lying    during session     NO, added to HEP 05/14/23     supine apophyseal glide ( upslides) with towel edge : rotation r/l   x  10 each    no, added to HEP 05/19/23     mid and lower cervical segmental up slides and down slides  grade II     YES     supine subcranial flexion/extension  over towel roll   10    no     subcranial jt mob C0 side bending left      yes                 Assessment:  today the pt's AROM is improved from [facility] and she is less acutely painful . She has recovered from her most recent exacerbation. She reports improved sleep with using her buckwheat pillow.  She has met medicare criteria to continue her care past the 10th session.      Short-Term Goals: (3 Weeks):   - Patient will demonstrate improved cervical AROM by at least 10 degrees in transverse and frontal planes to aid in  completion of ADLs.  ( Met in part 06/04/23)  - Patient will demonstrate independence with progressive HEP to maximize gains from PT. ( Met 06/04/23)  - Patient will report max SPS rating below 8/10 pain to aid in completion of ADLs duties. ( Met 06/04/23)      Long-Term Goals: (6 Weeks):     - Patient will demonstrate improved cervical AROM to to [facility] with non painful end feel to aid in completion of ADLs. ( Met in part 2/6, but not in all directions)   - Patient will report max 4/10 pain to aid in completion of ADLs/work duties.   - the pt will have segmental/gross ROM and strength WFL to permit normal cervical thoracic disassociation with active mobility      Plan:  continue with active and passive mobility exercise to improve segmental and gross cervical ROM . Start subcranial isometrics  in neutral.     Total Session Time 42 and Timed code minutes 42   ULTRASOUND and JOINT MOBILIZATION/MFR 34 minutes      Lunette Stands, PT  06/04/2023, [phone]

## 2023-06-09 ENCOUNTER — Ambulatory Visit (HOSPITAL_COMMUNITY): Payer: Self-pay

## 2023-06-11 ENCOUNTER — Ambulatory Visit
Admission: RE | Admit: 2023-06-11 | Discharge: 2023-06-11 | Disposition: A | Discharge status: Still In Hospital | Payer: Medicare Other | Source: Ambulatory Visit | Attending: PHYSICIAN ASSISTANT | Admitting: PHYSICIAN ASSISTANT

## 2023-06-11 ENCOUNTER — Other Ambulatory Visit: Payer: Self-pay

## 2023-06-11 NOTE — PT Treatment (Signed)
[facility] Medicine [facility]  Outpatient Physical Therapy  [address]  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  She keeps  a dull right side neck pain with movement or lifting with her right arm .  Her SPS = 6  with turning  head  to right , resting pain = 3.    Objective:       Measured ROM:  45 degrees left rotation ( pulls right ) , 45 degrees right rotation ( limited by right sided pain, was 35 degrees upon SOC ) , 65 degrees extension with left deviation , 60 degrees flexion  with right myofascial pull; 15 degrees right lateral flexion , 5 degrees left  lateral flexion .   EXERCISE/ACTIVITY NAME REPETITIONS RESISTANCE COMPLETED THIS DOS    [place] to paraspinals, [place]     8 min; 1.0 MHz, 1.3 w/cm2, intensity, 50% duty cycle    Yes, to right cervical facets   MHP X 5 MINUTES     Yes,during session    Manual therapies:  Gentle cervical traction  Manual ROM in supine  Upper trap stretch  Scalene release  Anterior chest stretch manual   Passive axial extension cervical spine                Mod overpressure, static holds 30 sec      Yes  Yes  Yes  Yes ( right)   no  yes    Seated scapular retraction   Seated cervical retraction  5 sec x 10  5 sec x 10   NO  NO    positional static anterior chest release in supine hook lying    during session     NO, added to HEP 05/14/23     supine apophyseal  glide ( upslides) with towel edge : rotation r/l   x 10 each    no, added to HEP 05/19/23     mid and lower cervical segmental up slides and down slides   C1/C2 ROTATION R  grade II        Grade II, III     YES      YES     supine subcranial flexion/extension  over towel roll   10    no     subcranial jt mob C0 side bending left      no     cervical stabilization with standing trunk rotation l/r  manual assistance to keep head in mid line; 2 x 10 reps     yes      Assessment:   the pt's cervical AROM is only modestly increased from [facility] but she has had  no further gains in ROM since 05/14/23. She  likely has reached her maximum potential  for ROM gains given the pathology in her neck ( based on MRI findings) .She has met STGs but not LTGs.   She exhibits poor cervical thoracic disassociation with all active movement  of her head and trunk.   She remains guarded with PROM  in NWB. Due to memory issues she has difficulty performing and following through with an effective HEP.     Short-Term Goals: (3 Weeks):   - Patient will demonstrate improved cervical AROM by at least 10 degrees in transverse and frontal planes to aid in completion of ADLs.  ( Met in part 06/04/23)  - Patient will demonstrate independence with progressive HEP to maximize gains from PT. ( Met 06/04/23)  - Patient will report max SPS rating below 8/10 pain to aid in completion of ADLs duties. ( Met 06/04/23)      Long-Term Goals: (6 Weeks):     - Patient will demonstrate improved cervical AROM to to [facility] with non painful end feel to aid in completion of ADLs. ( Met in part 2/6, but not in all directions)   - Patient will report max 4/10 pain to aid in completion of ADLs/work duties. (  Not met 06/11/23)   - the pt will have segmental/gross ROM and strength WFL to permit normal cervical thoracic disassociation with active mobility ( not met )       Plan: ONE MORE SESSION PER ORIGINAL POC. WILL PLAN DISCHARGE WITH HEP AT THAT TIME. THE PT EXPRESSES INTEREST IN  SEEING A MASSAGE THERAPIST AND She WAS ADVISED TO DISCUSS THIS WITH HER PCP.     Total Session Time 37 and Timed code minutes 37  THERAPEUTIC EXERCISE 15 minutes and JOINT MOBILIZATION/MFR 23 minutes      Lunette Stands, PT  06/11/2023, 1550

## 2023-06-16 ENCOUNTER — Ambulatory Visit (HOSPITAL_COMMUNITY): Payer: Self-pay

## 2023-06-17 ENCOUNTER — Encounter (INDEPENDENT_AMBULATORY_CARE_PROVIDER_SITE_OTHER): Payer: Self-pay | Admitting: PHYSICIAN ASSISTANT

## 2023-06-18 ENCOUNTER — Ambulatory Visit (HOSPITAL_COMMUNITY): Payer: Self-pay

## 2023-06-18 NOTE — Progress Notes (Incomplete)
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  She keeps  a dull right side neck pain with movement or lifting with her right arm .  Her SPS = 6  with turning  head  to right , resting pain = 3.     Objective:       Measured ROM:  45 degrees left rotation ( pulls right ) , 45 degrees right rotation ( limited by right sided pain, was 35 degrees upon SOC ) , 65 degrees extension with left deviation , 60 degrees flexion  with right myofascial pull; 15 degrees right lateral flexion , 5 degrees left  lateral flexion .   EXERCISE/ACTIVITY NAME REPETITIONS RESISTANCE COMPLETED THIS DOS    [place] to paraspinals, [place]     8 min; 1.0 MHz, 1.3 w/cm2, intensity, 50% duty cycle    Yes, to right cervical facets   MHP X 5 MINUTES     Yes,during session    Manual therapies:  Gentle cervical traction  Manual ROM in supine  Upper trap stretch  Scalene release  Anterior chest stretch manual   Passive axial extension cervical spine                Mod overpressure, static holds 30 sec      Yes  Yes  Yes  Yes ( right)   no  yes    Seated scapular retraction   Seated cervical retraction  5 sec x 10  5 sec x 10   NO  NO    positional static anterior chest release in supine hook lying    during session     NO, added to HEP 05/14/23     supine apophyseal  glide ( upslides) with towel edge : rotation r/l   x 10 each    no, added to HEP 05/19/23     mid and lower cervical segmental up slides and down slides   C1/C2 ROTATION R  grade II          Grade II, III     YES        YES     supine subcranial flexion/extension  over towel roll   10    no     subcranial jt mob C0 side bending left      no     cervical stabilization with standing trunk rotation l/r  manual assistance to keep head in mid line; 2 x 10 reps     yes      Assessment:   the pt's cervical AROM is only modestly increased from [facility] but she has had  no further gains in ROM  since 05/14/23. She likely has reached her maximum potential  for ROM gains given the pathology in her neck ( based on MRI findings) .She has met STGs but not LTGs.   She exhibits poor cervical thoracic disassociation with all active movement  of her head and trunk.   She remains guarded with PROM  in NWB. Due to memory issues she has difficulty performing and following through with an effective HEP.      Short-Term Goals: (3 Weeks):   - Patient will demonstrate improved cervical AROM by at least 10 degrees in transverse and frontal planes to aid in completion of ADLs.  ( Met in part 06/04/23)  - Patient will demonstrate independence with progressive HEP to maximize gains from PT. ( Met 06/04/23)  - Patient will report max SPS rating below 8/10 pain to aid in completion of ADLs duties. ( Met 06/04/23)      Long-Term Goals: (6 Weeks):     - Patient will demonstrate improved cervical AROM to to [facility] with non painful end feel to aid in completion of ADLs. ( Met in part 2/6, but not in all directions)   - Patient will report max 4/10 pain to aid in completion of ADLs/work duties. (  Not met 06/11/23)   - the pt will have segmental/gross ROM and strength WFL to permit normal cervical thoracic disassociation with active mobility ( not met )            Plan: ***    {PRN Timed/Untimed billable minutes:43252}  {INTERVENTION MINUTES:42227}      Lunette Stands, PT   06/18/2023, 08:48

## 2023-06-24 ENCOUNTER — Ambulatory Visit (INDEPENDENT_AMBULATORY_CARE_PROVIDER_SITE_OTHER): Payer: Medicare Other | Admitting: PHYSICIAN ASSISTANT

## 2023-06-24 ENCOUNTER — Encounter (INDEPENDENT_AMBULATORY_CARE_PROVIDER_SITE_OTHER): Payer: Self-pay | Admitting: PHYSICIAN ASSISTANT

## 2023-06-24 ENCOUNTER — Other Ambulatory Visit: Payer: Self-pay

## 2023-06-24 VITALS — BP 131/77 | HR 71 | Temp 98.1°F | Resp 14 | Ht 66.0 in | Wt 185.0 lb

## 2023-06-24 DIAGNOSIS — M2550 Pain in unspecified joint: Secondary | ICD-10-CM

## 2023-06-24 DIAGNOSIS — M542 Cervicalgia: Secondary | ICD-10-CM

## 2023-06-24 DIAGNOSIS — M4802 Spinal stenosis, cervical region: Secondary | ICD-10-CM

## 2023-06-24 DIAGNOSIS — E782 Mixed hyperlipidemia: Secondary | ICD-10-CM

## 2023-06-24 MED ORDER — MELOXICAM 7.5 MG TABLET
7.5000 mg | ORAL_TABLET | Freq: Two times a day (BID) | ORAL | 0 refills | Status: DC | PRN
Start: 2023-06-24 — End: 2023-07-22

## 2023-06-24 NOTE — Nursing Note (Signed)
 06/24/23 1633   Fall Risk Assessment   Do you feel unsteady when standing or walking? Yes   Do you worry about falling? Yes   Have you fallen in the past year? No

## 2023-06-24 NOTE — Nursing Note (Signed)
 06/24/23 1632   Domestic Violence   Because we are aware of abuse and domestic violence today, we ask all patients: Are you being hurt, hit, or frightened by anyone at your home or in your life?  N   Basic Needs   Do you have any basic needs within your home that are not being met? (such as Food, Shelter, [employer], Tranportation, paying for bills and/or medications) N

## 2023-06-24 NOTE — Nursing Note (Signed)
 06/24/23 1633   PHQ 9 (follow up)   Little interest or pleasure in doing things. 1   Feeling down, depressed, or hopeless 1   PHQ 2 Total 2   Trouble falling or staying asleep, or sleeping too much. 0   Feeling tired or having little energy 1   Poor appetite or overeating 0   Feeling bad about yourself/ that you are a failure in the past 2 weeks? 0   Trouble concentrating on things in the past 2 weeks? 1   Moving/Speaking slowly or being fidgety or restless  in the past 2 weeks? 1   Thoughts that you would be better off DEAD, or of hurting yourself in some way. 0   If you checked off any problems, how difficult have these problems made it for you to do your work, take care of things at home, or get along with other people? Somewhat difficult   PHQ 9 Total 5   Interpretation of Total Score Mild depression

## 2023-06-26 ENCOUNTER — Other Ambulatory Visit: Payer: Self-pay

## 2023-06-26 ENCOUNTER — Ambulatory Visit (INDEPENDENT_AMBULATORY_CARE_PROVIDER_SITE_OTHER)

## 2023-06-26 ENCOUNTER — Other Ambulatory Visit: Attending: PHYSICIAN ASSISTANT | Admitting: PHYSICIAN ASSISTANT

## 2023-06-26 DIAGNOSIS — E782 Mixed hyperlipidemia: Secondary | ICD-10-CM

## 2023-06-26 DIAGNOSIS — M2550 Pain in unspecified joint: Secondary | ICD-10-CM

## 2023-06-26 LAB — HEPATIC FUNCTION PANEL
ALBUMIN/GLOBULIN RATIO: 1.3 (ref 0.8–1.4)
ALBUMIN: 3.9 g/dL (ref 3.5–5.7)
ALKALINE PHOSPHATASE: 84 U/L (ref 34–104)
ALT (SGPT): 14 U/L (ref 7–52)
AST (SGOT): 16 U/L (ref 13–39)
BILIRUBIN DIRECT: 0.07 md/dL (ref 0.03–0.18)
BILIRUBIN TOTAL: 0.6 mg/dL (ref 0.3–1.0)
BILIRUBIN, INDIRECT: 0.53 mg/dL (ref <=1)
GLOBULIN: 3.1 (ref 2.0–3.5)
PROTEIN TOTAL: 7 g/dL (ref 6.4–8.9)

## 2023-06-26 LAB — LIPID PANEL
CHOL/HDL RATIO: 2.3
CHOLESTEROL: 207 mg/dL — ABNORMAL HIGH (ref <200)
HDL CHOL: 90 mg/dL (ref >=40)
LDL CALC: 100 mg/dL (ref 0–100)
TRIGLYCERIDES: 87 mg/dL (ref <=150)
VLDL CALC: 17 mg/dL (ref 0–50)

## 2023-06-26 LAB — LDH: LDH: 189 U/L (ref 140–271)

## 2023-06-26 LAB — CREATINE KINASE (CK), TOTAL, SERUM OR PLASMA: CREATINE KINASE: 84 U/L (ref 30–223)

## 2023-06-26 NOTE — Progress Notes (Signed)
 FAMILY MEDICINE, MEDICAL OFFICE BUILDING  [address]  [place] [address]      Progress Note    Bonnie Kline  [birth year]  [id number]    Date of Service: 06/24/2023  4:45 PM EST    Chief complaint:   Chief Complaint   Patient presents with    Follow Up     Having right sided neck pain x1 month. Has done PT with no relief. Limited range of motion at times and has to take Advil or use a cream.        Subjective:     This is a case of a 73 y.o. year old female who comes in today for follow up after PT on neck. Pt.reports it helped some, she is doing her home exercises program also. Pt.states the pain is constant and mainly on right side of neck. No arm numbness etc. Pt.has also noticed that in the am she feels so stiff in her joints that it take about 15 minutes, to get moving and not stiff. No joint swelling or erythema episodes. No other issues today.     Review of Systems   Constitutional:  Negative for activity change, fatigue and unexpected weight change.   HENT: Negative.  Negative for sore throat, trouble swallowing and voice change.    Respiratory:  Negative for cough, chest tightness, shortness of breath and wheezing.    Cardiovascular:  Negative for chest pain.   Gastrointestinal:  Negative for abdominal pain, constipation, diarrhea, nausea and vomiting.   Musculoskeletal:  Positive for arthralgias, neck pain and neck stiffness. Negative for gait problem and joint swelling.   Neurological:  Negative for dizziness, light-headedness, numbness and headaches.   Psychiatric/Behavioral: Negative.          Current Outpatient Medications   Medication Sig    ALPRAZolam (XANAX) 0.5 mg Oral Tablet Take 1 Tablet (0.5 mg total) by mouth Every night as needed for Anxiety (Patient not taking: Reported on 06/24/2023)    aspirin (ECOTRIN) 81 mg Oral Tablet, Delayed Release (E.C.) Take 1 Tablet (81 mg total) by mouth Once a day for 90 days    buPROPion (WELLBUTRIN) 100 mg Oral Tablet Take 1 Tablet (100 mg total) by  mouth Twice daily (Patient taking differently: Take 1 Tablet (100 mg total) by mouth Once a day)    cholecalciferol, vitamin D3, (VITAMIN D-3) 50 mcg (2,000 unit) Oral Tablet Take 1 Tablet (2,000 Units total) by mouth Once a day for 90 days    diosmin complex no.1 (VASCULERA) 630 mg Oral Tablet Take 1 Tablet (630 mg total) by mouth Once a day    FLUoxetine (PROZAC) 40 mg Oral Capsule Take 1 Capsule (40 mg total) by mouth Once a day    lamoTRIgine (LAMICTAL) 25 mg Oral Tablet Take 1 Tablet (25 mg total) by mouth Once a day    latanoprost (XALATAN) 0.005 % Ophthalmic Drops Instill 1 Drop into both eyes Once a day    Magnesium Glycinate 100 mg Oral Tablet Take 4 Tablets (400 mg total) by mouth Once a day    meloxicam (MOBIC) 7.5 mg Oral Tablet Take 1 Tablet (7.5 mg total) by mouth Twice per day as needed for Pain    multivitamin-minerals-lutein (MULTIVITAMIN 50 PLUS) Oral Tablet Take 1 Tablet by mouth Once a day (Patient not taking: Reported on 06/24/2023)    pantoprazole (PROTONIX) 40 mg Oral Tablet, Delayed Release (E.C.) Take 1 Tablet (40 mg total) by mouth Once a day  pravastatin (PRAVACHOL) 20 mg Oral Tablet Take 1 Tablet (20 mg total) by mouth Once a day    SIMBRINZA 1-0.2 % Ophthalmic Drops, Suspension Instill 1 Drop into both eyes Twice daily    solifenacin (VESICARE) 10 mg Oral Tablet Take 1 Tablet (10 mg total) by mouth Once a day       Objective:     BP 131/77   Pulse 71   Temp 36.7 C (98.1 F) (Temporal)   Resp 14   Ht 1.676 m (5\' 6" )   Wt 83.9 kg (185 lb)   SpO2 94%   BMI 29.86 kg/m        Physical Exam  Vitals and nursing note reviewed.   Constitutional:       General: She is not in acute distress.     Appearance: Normal appearance. She is normal weight.   HENT:      Head: Normocephalic and atraumatic.      Mouth/Throat:      Pharynx: Oropharynx is clear.   Eyes:      Extraocular Movements: Extraocular movements intact.      Conjunctiva/sclera: Conjunctivae normal.      Pupils: Pupils are equal,  round, and reactive to light.   Neck:      Comments: Right side of neck at the SCM is tight and very tender to touch, some limited ROM to the right, extension and flexion ok, very tender to touch a traps bilaterally with spasm noted on right and tender to touch at cervical spine vertebrae strength against reistance intact, grip intact..  Cardiovascular:      Rate and Rhythm: Normal rate and regular rhythm.      Heart sounds: Normal heart sounds.   Pulmonary:      Effort: Pulmonary effort is normal.      Breath sounds: Normal breath sounds.   Musculoskeletal:         General: No swelling, tenderness or deformity.   Lymphadenopathy:      Cervical: No cervical adenopathy.   Skin:     General: Skin is warm and dry.   Neurological:      General: No focal deficit present.      Mental Status: She is alert. Mental status is at baseline.      Cranial Nerves: No cranial nerve deficit.      Gait: Gait normal.   Psychiatric:         Mood and Affect: Mood normal.         Behavior: Behavior normal.         Thought Content: Thought content normal.       Assessment & Plan  Mixed hyperlipidemia  D/C Pravachol and see if helps arthralgia  Determine lipid tx after labs reviewed  Orders:    LIPID PANEL; Future    HEPATIC FUNCTION PANEL; Future    Neck pain    Orders:    Referral to External Provider    Referral to External Provider    Spinal stenosis of cervical region  Review mri of neck done  09/2022  Orders:    Referral to External Provider    Referral to External Provider    Arthralgia  D/C Pravachol due to arthralgia   Advise pt. To try Mobic 7.5 mg before bed to see if helps with am stiffness  If the stiffness continues we will do additional evaluation  Orders:    CREATINE KINASE (CK), TOTAL, SERUM OR PLASMA; Future    LDH; Future  Other orders    meloxicam (MOBIC) 7.5 mg Oral Tablet; Take 1 Tablet (7.5 mg total) by mouth Twice per day as needed for Pain          The patient was given the opportunity to ask questions and  those questions were answered to the patient's satisfaction. The patient was encouraged to call with any additional questions or concerns.    Follow up: Return in about 2 months (around 08/22/2023).    [name], PA-C

## 2023-06-29 ENCOUNTER — Ambulatory Visit (INDEPENDENT_AMBULATORY_CARE_PROVIDER_SITE_OTHER): Payer: Self-pay | Admitting: PHYSICIAN ASSISTANT

## 2023-07-20 ENCOUNTER — Other Ambulatory Visit (INDEPENDENT_AMBULATORY_CARE_PROVIDER_SITE_OTHER): Payer: Self-pay | Admitting: PHYSICIAN ASSISTANT

## 2023-07-20 DIAGNOSIS — E785 Hyperlipidemia, unspecified: Secondary | ICD-10-CM

## 2023-07-20 DIAGNOSIS — K219 Gastro-esophageal reflux disease without esophagitis: Secondary | ICD-10-CM

## 2023-07-20 MED ORDER — SOLIFENACIN 10 MG TABLET
10.0000 mg | ORAL_TABLET | Freq: Every day | ORAL | 0 refills | Status: AC
Start: 2023-07-20 — End: ?

## 2023-07-20 MED ORDER — PRAVASTATIN 20 MG TABLET
20.0000 mg | ORAL_TABLET | Freq: Every day | ORAL | 0 refills | Status: DC
Start: 2023-07-20 — End: 2023-12-16

## 2023-07-20 MED ORDER — PANTOPRAZOLE 40 MG TABLET,DELAYED RELEASE
40.0000 mg | DELAYED_RELEASE_TABLET | Freq: Every day | ORAL | 0 refills | Status: DC
Start: 2023-07-20 — End: 2023-12-16

## 2023-07-22 ENCOUNTER — Other Ambulatory Visit (INDEPENDENT_AMBULATORY_CARE_PROVIDER_SITE_OTHER): Payer: Self-pay | Admitting: PHYSICIAN ASSISTANT

## 2023-07-24 ENCOUNTER — Encounter (INDEPENDENT_AMBULATORY_CARE_PROVIDER_SITE_OTHER): Payer: Self-pay | Admitting: PHYSICIAN ASSISTANT

## 2023-08-11 ENCOUNTER — Other Ambulatory Visit (INDEPENDENT_AMBULATORY_CARE_PROVIDER_SITE_OTHER): Payer: Self-pay

## 2023-08-16 ENCOUNTER — Encounter (INDEPENDENT_AMBULATORY_CARE_PROVIDER_SITE_OTHER): Payer: Self-pay | Admitting: PHYSICIAN ASSISTANT

## 2023-08-17 ENCOUNTER — Encounter (INDEPENDENT_AMBULATORY_CARE_PROVIDER_SITE_OTHER): Payer: Self-pay | Admitting: PHYSICIAN ASSISTANT

## 2023-08-17 ENCOUNTER — Other Ambulatory Visit (INDEPENDENT_AMBULATORY_CARE_PROVIDER_SITE_OTHER): Payer: Self-pay | Admitting: PHYSICIAN ASSISTANT

## 2023-08-17 ENCOUNTER — Ambulatory Visit (HOSPITAL_BASED_OUTPATIENT_CLINIC_OR_DEPARTMENT_OTHER): Payer: Self-pay | Admitting: PHYSICIAN ASSISTANT

## 2023-08-17 ENCOUNTER — Ambulatory Visit: Payer: Self-pay | Attending: PHYSICIAN ASSISTANT | Admitting: PHYSICIAN ASSISTANT

## 2023-08-17 ENCOUNTER — Other Ambulatory Visit: Payer: Self-pay

## 2023-08-17 VITALS — BP 119/68 | HR 65 | Temp 98.2°F | Ht 66.0 in | Wt 189.0 lb

## 2023-08-17 DIAGNOSIS — R109 Unspecified abdominal pain: Secondary | ICD-10-CM | POA: Insufficient documentation

## 2023-08-17 DIAGNOSIS — G8929 Other chronic pain: Secondary | ICD-10-CM | POA: Insufficient documentation

## 2023-08-17 DIAGNOSIS — Z79899 Other long term (current) drug therapy: Secondary | ICD-10-CM | POA: Insufficient documentation

## 2023-08-17 DIAGNOSIS — L989 Disorder of the skin and subcutaneous tissue, unspecified: Secondary | ICD-10-CM | POA: Insufficient documentation

## 2023-08-17 DIAGNOSIS — R09A2 Foreign body sensation, throat: Secondary | ICD-10-CM

## 2023-08-17 DIAGNOSIS — R140 Abdominal distension (gaseous): Secondary | ICD-10-CM | POA: Insufficient documentation

## 2023-08-17 DIAGNOSIS — F332 Major depressive disorder, recurrent severe without psychotic features: Secondary | ICD-10-CM | POA: Insufficient documentation

## 2023-08-17 DIAGNOSIS — M542 Cervicalgia: Secondary | ICD-10-CM | POA: Insufficient documentation

## 2023-08-17 DIAGNOSIS — R1310 Dysphagia, unspecified: Secondary | ICD-10-CM

## 2023-08-17 DIAGNOSIS — Z1239 Encounter for other screening for malignant neoplasm of breast: Secondary | ICD-10-CM

## 2023-08-17 DIAGNOSIS — Z85038 Personal history of other malignant neoplasm of large intestine: Secondary | ICD-10-CM | POA: Insufficient documentation

## 2023-08-17 DIAGNOSIS — F321 Major depressive disorder, single episode, moderate: Secondary | ICD-10-CM | POA: Insufficient documentation

## 2023-08-17 DIAGNOSIS — K5900 Constipation, unspecified: Secondary | ICD-10-CM | POA: Insufficient documentation

## 2023-08-17 DIAGNOSIS — Z0000 Encounter for general adult medical examination without abnormal findings: Secondary | ICD-10-CM | POA: Insufficient documentation

## 2023-08-17 DIAGNOSIS — Z0001 Encounter for general adult medical examination with abnormal findings: Secondary | ICD-10-CM | POA: Insufficient documentation

## 2023-08-17 LAB — POCT URINE DIPSTICK
BILIRUBIN: NEGATIVE
BLOOD: NEGATIVE
GLUCOSE: NEGATIVE
KETONE: NEGATIVE
LEUKOCYTES: NEGATIVE
NITRITE: NEGATIVE
PH: 7
PROTEIN: NEGATIVE
SPECIFIC GRAVITY: 1.02
UROBILINOGEN: 0.2

## 2023-08-17 MED ORDER — POLYETHYLENE GLYCOL 3350 17 GRAM/DOSE ORAL POWDER
17.0000 g | Freq: Every day | ORAL | 1 refills | Status: DC
Start: 2023-08-17 — End: 2023-11-27

## 2023-08-17 NOTE — Nursing Note (Signed)
 08/17/23 1200   Urine Test   Performed Status: Automated   Time collected 1258   Manufacturer Siemens   Urine Test - Siemens   Color (Ref Range: Yellow) Yellow   Clarity (Ref Range: Clear) Clear   Glucose (Ref Range: Negative mg/dL) Negative   Bilirubin (Ref Range: Negative mg/dL) Negative   Ketones (Ref Range: Negative mg/dL) Negative   Urine Specific Gravity (Ref Range: 1.005 - 1.030) 1.020   Blood (urine) (Ref Range: Negative mg/dL) Negative   pH (Ref Range: 5.0 - 8.0) 7.0   Protein (Ref Range: Negative mg/dL) Negative   Urobilinogen (Ref Range: Normal) 0.2mg /dL (Normal)   Nitrite (Ref Range: Negative) Negative   Leukocytes (Ref Range: Negative WBC's/uL) Negative   Micro Sent no   Culture Sent no   Initials CF

## 2023-08-17 NOTE — Nursing Note (Signed)
 08/17/23 1023   Domestic Violence   Because we are aware of abuse and domestic violence today, we ask all patients: Are you being hurt, hit, or frightened by anyone at your home or in your life?  N   Basic Needs   Do you have any basic needs within your home that are not being met? (such as Food, Shelter, [employer] fast streamer, Tranportation, paying for bills and/or medications) N

## 2023-08-17 NOTE — Nursing Note (Signed)
 08/17/23 1027   Comprehensive Health Assessment-Adult   Do you wish to complete this form? Yes   During the past 4 weeks, how would you rate your health in general? Good   During the past 4 weeks, how much difficulty have you had doing your usual activities inside and outside your home because of medical or emotional problems? No difficulty at all   During the past 4 weeks, was someone available to help you if you needed and wanted help? Yes, as much as I wanted   In the past year, how many times have you gone to the emergency department or been admitted to a hospital for a health problem? None   Are you generally satisfied with your sleep? Yes   Do you have enough money to buy things you need in everyday life, such as food, clothing, medicines, and housing? Yes, always   Can you get to places beyond walking distance without help?  (For example, can you drive your own car or travel alone on buses)? Yes   Do you fasten your seatbelt when you are in a car? Yes, usually   Do you exercise 20 minutes 3 or more days per week (such as walking, dancing, biking, mowing grass, swimming)? Yes, most of the time   How often do you eat food that is healthy (fruits, vegetables, lean meats) instead of unhealthy (sweets, fast food, junk food, fatty foods)? Almost always   Have your parents, brothers or sisters had any of the following problems before the age of 61? (check all that apply) Heart problems, or hardening of the arteries;Cancer   How often do you have trouble taking medicines the eay you are told to take them? I always take them as prescribed   Do you need any help communicating with your doctors and nurses because of vision or hearing problems? No   During the past 12 months, have you experienced confusion or memory loss that is happening more often or is getting worse? Yes   Do you have one person you think of as your personal doctor (primary care provider or family doctor)? Yes   If you are seeing a Primary Care  Provider (PCP) or family doctor. please list their name [facility]   Are you now also seeing any specialist physician(s) (such as eye doctor, foot doctor, skin doctor)? Yes   If you are seeing a specialist for anything such as foot, eye, skin, etc.  please list their name(s) [facility], pain clinic   How confident are you that you can control or manage most of your health problems? Very confident

## 2023-08-17 NOTE — Nursing Note (Signed)
 08/17/23 1023   Medicare Wellness Assessment   Medicare initial or wellness physical in the last year? Yes   Advance Directives   Does patient have a living will or MPOA Yes   Has patient provided [employer] with a copy? Yes   Activities of Daily Living   Do you need help with dressing, bathing, or walking? No   Do you need help with shopping, housekeeping, medications, or finances? No   Do you have rugs in hallways, broken steps, or poor lighting? No   Do you have grab bars in your bathroom, non-slip strips in your tub, and hand rails on your stairs? Yes   Cognitive Function Screen   What is you age? 1   What is the time to the nearest hour? 1   Remember this address: [address]   What is the year? 1   What is the name of this clinic? 1   Can the patient recognize two persons (the doctor, the nurse, home help, etc.)? 1   What is the date of your birth? (day and month sufficient)  1   In what year did World War II end? 0   Who is the current president of the [place] ? 1   Count from 20 down to 1? 1   What address did I give you earlier? 0   Total Score 8   Interpretation of Total Score Greater than 6 Normal   Depression Screen   Little interest or pleasure in doing things. 0   Feeling down, depressed, or hopeless 1   PHQ 2 Total 1   Pain Score   Pain Score Zero   Substance Use Screening   In Past 12 MONTHS, how often have you used any tobacco product (for example, cigarettes, e-cigarettes, cigars, pipes, or smokeless tobacco)? Never   In the PAST 12 MONTHS, how often have you had 5 (men)/4 (women) or more drinks containing alcohol in one day? Never   In the PAST 12 months, how often have you used any prescription medications just for the feeling, more than prescribed, or that were not prescribed for you? Prescriptions may include: opioids, benzodiazepines, medications for ADHD Never   In the PAST 12 MONTHS, how often have you used any drugs, including marijuana, cocaine or crack,  heroin, methamphetamine, hallucinogens, ecstasy/MDMA? Never   Hearing Screen   Have you noticed any hearing difficulties? No   After whispering 9-1-6 how many numbers did the patient repeat correctly? 3   Fall Risk Assessment   Do you feel unsteady when standing or walking? No   Do you worry about falling? No   Have you fallen in the past year? No   Urinary Incontinence Screen   Do you ever leak urine when you don't want to? YES

## 2023-08-17 NOTE — Progress Notes (Unsigned)
 FAMILY MEDICINE, MEDICAL OFFICE BUILDING  [address]  [place] [address]  Operated by [facility]    Progress Note    Bonnie Kline  11-29-1950  [id number]    Date of Service: 08/17/2023 10:30 AM EDT    Chief complaint:   Chief Complaint   Patient presents with    Follow Up     Wants spot on left shoulder looked at.   Also complains of "gas problems".   Wants to talk about Colonoscopy.        Subjective:     This is a case of a 73 y.o. year old female who comes in today for follow up with daughter Bonnie Kline. Pt.went to memory clinic at [facility] and the neuro said she seemed stable, and the memory clinic nurse was not there pt.scored 25/30 and follow up prn. Pt.reports she notices she continues she  forgets things, and drop things. Pt.to follow up prn with neuro.   Pt.to see Dr.Yee end of month for neck pain. Pt.sees Tonya McFadden and psych, Hollie D. Pt.continues to have a lot of gas ongoing for months without pattern.     Review of Systems   Constitutional: Negative.    HENT:  Positive for trouble swallowing.         Clear throat a lot and cough really heavy at time, and trouble swallowing .    Respiratory:  Negative for cough, shortness of breath and wheezing.    Cardiovascular:  Negative for chest pain, palpitations and leg swelling.   Gastrointestinal:  Positive for constipation. Negative for abdominal pain, nausea and vomiting.        Pt.continues to have a lot of gas ongoing for months. Pt.states random episodes "bad" cough.   Genitourinary:         Vaginal itching at times and uses OTC hydrocortisone cream with relief.   Musculoskeletal:  Positive for back pain, neck pain and neck stiffness. Negative for arthralgias.   Neurological:  Positive for dizziness. Negative for light-headedness and headaches.   Psychiatric/Behavioral:  Positive for agitation and dysphoric mood. The patient is nervous/anxious.       Current Outpatient Medications   Medication Sig    buPROPion  (WELLBUTRIN )  100 mg Oral Tablet Take 1 Tablet (100 mg total) by mouth Twice daily (Patient taking differently: Take 1 Tablet (100 mg total) by mouth Daily)    diosmin complex no.1 (VASCULERA) 630 mg Oral Tablet Take 1 Tablet (630 mg total) by mouth Daily    FLUoxetine  (PROZAC ) 40 mg Oral Capsule Take 1 Capsule (40 mg total) by mouth Once a day    lamoTRIgine (LAMICTAL) 25 mg Oral Tablet Take 1 Tablet (25 mg total) by mouth Daily    latanoprost (XALATAN) 0.005 % Ophthalmic Drops Instill 1 Drop into both eyes Once a day    Magnesium Glycinate 100 mg Oral Tablet Take 4 Tablets (400 mg total) by mouth Once a day    meloxicam  (MOBIC ) 7.5 mg Oral Tablet TAKE 1 TABLET(7.5 MG) BY MOUTH TWICE DAILY AS NEEDED FOR PAIN    multivitamin-minerals-lutein (MULTIVITAMIN 50 PLUS) Oral Tablet Take 1 Tablet by mouth Daily    pantoprazole  (PROTONIX ) 40 mg Oral Tablet, Delayed Release (E.C.) Take 1 Tablet (40 mg total) by mouth Daily    polyethylene glycol (MIRALAX ) 17 gram/dose Oral Powder Take 3 teaspoons (17 g total) by mouth Daily    pravastatin  (PRAVACHOL ) 20 mg Oral Tablet Take 1 Tablet (20 mg total) by mouth Daily  SIMBRINZA 1-0.2 % Ophthalmic Drops, Suspension Instill 1 Drop into both eyes Twice daily    solifenacin  (VESICARE ) 10 mg Oral Tablet Take 1 Tablet (10 mg total) by mouth Daily       Objective:     BP 119/68   Pulse 65   Temp 36.8 C (98.2 F) (Temporal)   Ht 1.676 m (5\' 6" )   Wt 85.7 kg (189 lb)   SpO2 95%   BMI 30.51 kg/m        Physical Exam  Vitals and nursing note reviewed.   Constitutional:       General: She is not in acute distress.     Appearance: Normal appearance.   HENT:      Head: Normocephalic and atraumatic.      Mouth/Throat:      Mouth: Mucous membranes are moist.      Pharynx: Oropharynx is clear.   Eyes:      Extraocular Movements: Extraocular movements intact.      Conjunctiva/sclera: Conjunctivae normal.   Neck:      Vascular: No carotid bruit.   Cardiovascular:      Rate and Rhythm: Normal rate and  regular rhythm.      Heart sounds: Normal heart sounds.   Pulmonary:      Effort: Pulmonary effort is normal.      Breath sounds: Normal breath sounds.   Abdominal:      General: Abdomen is flat.      Tenderness: There is abdominal tenderness. There is guarding.      Comments: Pt.with right side abdominal pain and lower abdominal pain.   Musculoskeletal:      Cervical back: Normal range of motion and neck supple.   Lymphadenopathy:      Cervical: No cervical adenopathy.   Skin:     General: Skin is dry.      Findings: Lesion present.      Comments: Left anterior shoulder with a rough lesion with dark center and erythematous border. Numerous keratosis lesions.    Neurological:      General: No focal deficit present.      Mental Status: Mental status is at baseline.   Psychiatric:         Mood and Affect: Mood normal.         Behavior: Behavior normal.        Assessment & Plan  Moderate major depression (CMS HCC)  Continue with tonya mcfadden for counseling  Continue with her psych for med managment       Arm lesion  Refer to derm for eval   Orders:    Referral to External Provider    Abdominal pain, unspecified abdominal location  Checked u/a today and wnl  Orders:    POCT Urine Dipstick    Globus sensation    Orders:    Referral to External Provider    Encounter for screening for malignant neoplasm of breast, unspecified screening modality    Orders:    MAMMO BILATERAL SCREENING-ADDL VIEWS/BREAST US  AS REQ BY RAD; Future    Constipation  Start Miralax  OTC or script sent in  Review abd xrays        Gassiness  Try the Miralax   Refer EGD/colonoscopy       History of colon cancer  2015 colon cancer with colon resection  Last colonoscopy 2022, have eval to check her bothersome flatulence and GI issues.          Other orders  polyethylene glycol (MIRALAX ) 17 gram/dose Oral Powder; Take 3 teaspoons (17 g total) by mouth Daily      Continue meds as directed  Review last labs with pt.nand daughter, Bonnie Kline    The patient was  given the opportunity to ask questions and those questions were answered to the patient's satisfaction. The patient was encouraged to call with any additional questions or concerns.    Follow up: Return in about 6 weeks (around 09/28/2023).    [facility], PA-C

## 2023-08-18 NOTE — Progress Notes (Signed)
 FAMILY MEDICINE, MEDICAL OFFICE BUILDING  [address]  [place] [address]  Operated by [facility]  Medicare Annual Wellness Visit    Name: Bonnie Kline MRN:  [id number]   Date: 08/17/2023 Age: 73 y.o.       SUBJECTIVE:   Bonnie Kline is a 73 y.o. female for presenting for Medicare Wellness exam.   I have reviewed and reconciled the medication list with the patient today.        08/17/2023    10:27 AM   Comprehensive Health Assessment-Adult   Do you wish to complete this form? Yes   During the past 4 weeks, how would you rate your health in general? Good   During the past 4 weeks, how much difficulty have you had doing your usual activities inside and outside your home because of medical or emotional problems? No difficulty at all   During the past 4 weeks, was someone available to help you if you needed and wanted help? Yes, as much as I wanted   In the past year, how many times have you gone to the emergency department or been admitted to a hospital for a health problem? None   Are you generally satisfied with your sleep? Yes   Do you have enough money to buy things you need in everyday life, such as food, clothing, medicines, and housing? Yes, always   Can you get to places beyond walking distance without help?  (For example, can you drive your own car or travel alone on buses)? Yes   Do you fasten your seatbelt when you are in a car? Yes, usually   Do you exercise 20 minutes 3 or more days per week (such as walking, dancing, biking, mowing grass, swimming)? Yes, most of the time   How often do you eat food that is healthy (fruits, vegetables, lean meats) instead of unhealthy (sweets, fast food, junk food, fatty foods)? Almost always   Have your parents, brothers or sisters had any of the following problems before the age of 79? (check all that apply) Heart problems, or hardening of the arteries;Cancer   How often do you have trouble taking medicines the eay you are told to take  them? I always take them as prescribed   Do you need any help communicating with your doctors and nurses because of vision or hearing problems? No   During the past 12 months, have you experienced confusion or memory loss that is happening more often or is getting worse? Yes   Do you have one person you think of as your personal doctor (primary care provider or family doctor)? Yes   If you are seeing a Primary Care Provider (PCP) or family doctor. please list their name Mills Alma   Are you now also seeing any specialist physician(s) (such as eye doctor, foot doctor, skin doctor)? Yes   If you are seeing a specialist for anything such as foot, eye, skin, etc.  please list their name(s) [facility], [facility] clinic   How confident are you that you can control or manage most of your health problems? Very confident       I have reviewed and updated as appropriate the past medical, family and social history. 08/17/2023 as summarized below:  Past Medical History:   Diagnosis Date    Allergies     Anxiety     Asymptomatic varicose veins     B12 deficiency     Bilateral carotid bruits  Bulging lumbar disc     Dementia     Depression     Esophageal reflux     Hiatal hernia     History of colon cancer 2015    History of colonic polyps     Pre-Cancerous    HLD (hyperlipidemia)     Hypercalcemia     Hyperparathyroidism (CMS HCC)     Hypertension     Low magnesium level     Post-menopausal     Renal cyst     RLS (restless legs syndrome)     Sleep apnea     SVT (supraventricular tachycardia) (CMS HCC)     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)     Unspecified urinary incontinence     Vitamin D deficiency      Past Surgical History:   Procedure Laterality Date    Colonoscopy      Esophagogastroduodenoscopy      Hx cardiac ablation  2013    Hx carpal tunnel release Left 2010    Hx colon surgery (any)      Hx tah and bso  1998    Orthopedic surgery      Parathyroid  gland surgery       Current Outpatient Medications   Medication Sig     buPROPion  (WELLBUTRIN ) 100 mg Oral Tablet Take 1 Tablet (100 mg total) by mouth Twice daily (Patient taking differently: Take 1 Tablet (100 mg total) by mouth Daily)    diosmin complex no.1 (VASCULERA) 630 mg Oral Tablet Take 1 Tablet (630 mg total) by mouth Daily    FLUoxetine  (PROZAC ) 40 mg Oral Capsule Take 1 Capsule (40 mg total) by mouth Once a day    lamoTRIgine (LAMICTAL) 25 mg Oral Tablet Take 1 Tablet (25 mg total) by mouth Daily    latanoprost (XALATAN) 0.005 % Ophthalmic Drops Instill 1 Drop into both eyes Once a day    Magnesium Glycinate 100 mg Oral Tablet Take 4 Tablets (400 mg total) by mouth Once a day    meloxicam  (MOBIC ) 7.5 mg Oral Tablet TAKE 1 TABLET(7.5 MG) BY MOUTH TWICE DAILY AS NEEDED FOR PAIN    multivitamin-minerals-lutein (MULTIVITAMIN 50 PLUS) Oral Tablet Take 1 Tablet by mouth Daily    pantoprazole  (PROTONIX ) 40 mg Oral Tablet, Delayed Release (E.C.) Take 1 Tablet (40 mg total) by mouth Daily    polyethylene glycol (MIRALAX ) 17 gram/dose Oral Powder Take 3 teaspoons (17 g total) by mouth Daily    pravastatin  (PRAVACHOL ) 20 mg Oral Tablet Take 1 Tablet (20 mg total) by mouth Daily    SIMBRINZA 1-0.2 % Ophthalmic Drops, Suspension Instill 1 Drop into both eyes Twice daily    solifenacin  (VESICARE ) 10 mg Oral Tablet Take 1 Tablet (10 mg total) by mouth Daily     Family Medical History:       Problem Relation (Age of Onset)    Alzheimer's/Dementia Father    Atherosclerosis Mother    Breast Cancer Sister    Diabetes Daughter    Emphysema Mother    Heart Disease Father, Brother, Sister    Heart Surgery Brother    Hypertension (High Blood Pressure) Father    Pacemaker Sister            Social History     Socioeconomic History    Marital status: Widowed   Tobacco Use    Smoking status: Former     Types: Cigarettes     Passive exposure: Never    Smokeless tobacco: Never   Vaping  Use    Vaping status: Never Used   Substance and Sexual Activity    Alcohol use: Never    Drug use: Never      Social Determinants of Health     Health Literacy: Low Risk  (09/18/2022)    Health Literacy     SDOH Health Literacy: Never   Recent Concern: Health Literacy - Medium Risk (06/20/2022)    Health Literacy     SDOH Health Literacy: Sometimes         List of Current Health Care Providers   [facility]  Dr. Adah Acron Aguirre-Neuro at [facility]  Dr.Clawdges/[facility] w/CPAP  [facility]-scheduled end of 07/2023        Health Maintenance   Topic Date Due    Hepatitis C screening  Never done    Breast Cancer Screening  Never done    Medicare Annual Wellness Visit  Never done    Influenza Vaccine (Season Ended) 12/28/2023    Colonoscopy  03/14/2026    Osteoporosis screening  10/17/2031    Shingles Vaccine  Completed    Pneumococcal Vaccination, Age 62+  Completed    Adult Tdap-Td  Discontinued    Covid-19 Vaccine  Discontinued     Medicare Wellness Assessment   Medicare initial or wellness physical in the last year?: Yes  Advance Directives   Does patient have a living will or MPOA: Yes   Has patient provided [employer] with a copy?: Yes              Activities of Daily Living   Do you need help with dressing, bathing, or walking?: No   Do you need help with shopping, housekeeping, medications, or finances?: No   Do you have rugs in hallways, broken steps, or poor lighting?: No   Do you have grab bars in your bathroom, non-slip strips in your tub, and hand rails on your stairs?: Yes   Cognitive Function Screen (1=Yes, 0=No)   What is you age?: Correct   What is the time to the nearest hour?: Correct   What is the year?: Correct   What is the name of this clinic?: Correct   Can the patient recognize two persons (the doctor, the nurse, home help, etc.)?: Correct   What is the date of your birth? (day and month sufficient) : Correct   In what year did World War II end?: Incorrect   Who is the current president of the [place] ?: Correct   Count from 20 down to  1?: Correct   What address did I give you earlier?: Incorrect   Total Score: 8   Interpretation of Total Score: Greater than 6 Normal   Fall Risk Screen   Do you feel unsteady when standing or walking?: No  Do you worry about falling?: No  Have you fallen in the past year?: No   Depression Screen     Little interest or pleasure in doing things.: Not at all  Feeling down, depressed, or hopeless: Several Days  PHQ 2 Total: 1     Pain Score   Pain Score:   0 - No pain    Substance Use-Abuse Screening     Tobacco Use     In Past 12 MONTHS, how often have you used any tobacco product (for example, cigarettes, e-cigarettes, cigars, pipes, or smokeless tobacco)?: Never     Alcohol use     In the PAST 12 MONTHS, how often  have you had 5 (men)/4 (women) or more drinks containing alcohol in one day?: Never     Prescription Drug Use     In the PAST 12 months, how often have you used any prescription medications just for the feeling, more than prescribed, or that were not prescribed for you? Prescriptions may include: opioids, benzodiazepines, medications for ADHD: Never           Illicit Drug Use   In the PAST 12 MONTHS, how often have you used any drugs, including marijuana, cocaine or crack, heroin, methamphetamine, hallucinogens, ecstasy/MDMA?: Never        Hearing Screen   Have you noticed any hearing difficulties?: No  After whispering 9-1-6 how many numbers did the patient repeat correctly?: 3       Vision Screen                OBJECTIVE:   BP 119/68   Pulse 65   Temp 36.8 C (98.2 F) (Temporal)   Ht 1.676 m (5\' 6" )   Wt 85.7 kg (189 lb)   SpO2 95%   BMI 30.51 kg/m        Other appropriate exam:  Lungs: cta bilaterally  Heart:regular rate wo murmur  Pt. A&O interacts well.  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 Past mri of head, labs, psych evals [facility] at [facility].    The patient has been educated about risk factors and recommended preventive care. Written Prevention Plan completed/ updated and given to patient (see After Visit Summary).    RTC at regular scheduled visit, unless needed sooner.    Mills Alma, PA-C

## 2023-08-20 ENCOUNTER — Other Ambulatory Visit (INDEPENDENT_AMBULATORY_CARE_PROVIDER_SITE_OTHER): Payer: Self-pay

## 2023-09-01 ENCOUNTER — Encounter (INDEPENDENT_AMBULATORY_CARE_PROVIDER_SITE_OTHER): Payer: Self-pay | Admitting: PHYSICIAN ASSISTANT

## 2023-09-01 ENCOUNTER — Other Ambulatory Visit: Payer: Self-pay

## 2023-09-01 NOTE — H&P (Deleted)
 GENERAL SURGERY, [facility]  [address]  [place] [address]       Name: Bonnie Kline MRN:  [id number]   Date: 09/02/2023 Age: 73 y.o. 1950/09/29      PCP: Mills Alma, PA-C     Subjective  Bonnie Kline is a 73 y.o. year old female who presents for screening colonoscopy.  No current GI complaints.  No constipation.  No abdominal pain.  No rectal bleeding.  No unexplained weight loss.      Family history of colon cancer:  ***    Last colonoscopy:  ***    Last EGD performed by me January 2024 showing gastritis, hyperplastic gastric polyps and a 3 cm hiatal hernia    Patient's referral to this office included a recent assessment by the referring provider.  This was reviewed by me for this unique office visit for the indication and intent of the referral as well as any pertinent medical or surgical history relevant to the patient's independent evaluation by me today.     Allergies   Allergen Reactions    Amoxicillin Rash    Clavulanic Acid Rash    Penicillins Rash    Potassium Clavulanate Itching      Current Outpatient Medications   Medication Sig    buPROPion  (WELLBUTRIN ) 100 mg Oral Tablet Take 1 Tablet (100 mg total) by mouth Twice daily (Patient taking differently: Take 1 Tablet (100 mg total) by mouth Daily)    diosmin complex no.1 (VASCULERA) 630 mg Oral Tablet Take 1 Tablet (630 mg total) by mouth Daily    FLUoxetine  (PROZAC ) 40 mg Oral Capsule Take 1 Capsule (40 mg total) by mouth Once a day    lamoTRIgine (LAMICTAL) 25 mg Oral Tablet Take 1 Tablet (25 mg total) by mouth Daily    latanoprost (XALATAN) 0.005 % Ophthalmic Drops Instill 1 Drop into both eyes Once a day    Magnesium Glycinate 100 mg Oral Tablet Take 4 Tablets (400 mg total) by mouth Once a day    meloxicam  (MOBIC ) 7.5 mg Oral Tablet TAKE 1 TABLET(7.5 MG) BY MOUTH TWICE DAILY AS NEEDED FOR PAIN    multivitamin-minerals-lutein (MULTIVITAMIN 50 PLUS) Oral Tablet Take 1 Tablet by mouth Daily     pantoprazole  (PROTONIX ) 40 mg Oral Tablet, Delayed Release (E.C.) Take 1 Tablet (40 mg total) by mouth Daily    polyethylene glycol (MIRALAX ) 17 gram/dose Oral Powder Take 3 teaspoons (17 g total) by mouth Daily    pravastatin  (PRAVACHOL ) 20 mg Oral Tablet Take 1 Tablet (20 mg total) by mouth Daily    SIMBRINZA 1-0.2 % Ophthalmic Drops, Suspension Instill 1 Drop into both eyes Twice daily    solifenacin  (VESICARE ) 10 mg Oral Tablet Take 1 Tablet (10 mg total) by mouth Daily          Objective:     There were no vitals filed for this visit.     General: appropriate for age. in no acute distress.    Vital signs are present above and have been reviewed by me     HEENT: Atraumatic, Normocephalic.    Lungs: Nonlabored breathing with symmetric expansion    Heart:Regular wth respect to rate and rythmn.    Abdomen:Soft. Nontender. Nondistended     Psychiatric: Alert and oriented to person, place, and time. affect appropriate    Assessment/Plan  No diagnosis found.     Discussed indications, risks, and benefits of colonoscopy with the patient.  Discussed  the possibility of polypectomy, biopsies, and repeat possible examinations.  Risks discussed include bleeding, sedation risks, possibility of missed diagnosis of polyp malignancy, and remote possibility of perforation and/or death.  All questions answered and informed consent clearly obtained.    Office Visit was used for detailed explanation procedure and its indications, review of the patient's medications relative to the time before and after the procedure, and the effects of the associated medical conditions that affect the procedure preparation and procedure itself.    Alica Inks MD MBA CPE FACS

## 2023-09-02 ENCOUNTER — Ambulatory Visit (INDEPENDENT_AMBULATORY_CARE_PROVIDER_SITE_OTHER): Payer: Self-pay | Admitting: PHYSICIAN ASSISTANT

## 2023-09-02 ENCOUNTER — Encounter (INDEPENDENT_AMBULATORY_CARE_PROVIDER_SITE_OTHER): Payer: Self-pay | Admitting: PHYSICIAN ASSISTANT

## 2023-09-02 ENCOUNTER — Other Ambulatory Visit: Payer: Self-pay

## 2023-09-02 ENCOUNTER — Ambulatory Visit (HOSPITAL_BASED_OUTPATIENT_CLINIC_OR_DEPARTMENT_OTHER)
Admission: RE | Admit: 2023-09-02 | Discharge: 2023-09-02 | Disposition: A | Discharge status: Home / Self Care | Source: Ambulatory Visit | Attending: PHYSICIAN ASSISTANT | Admitting: PHYSICIAN ASSISTANT

## 2023-09-02 ENCOUNTER — Ambulatory Visit: Attending: PHYSICIAN ASSISTANT | Admitting: PHYSICIAN ASSISTANT

## 2023-09-02 ENCOUNTER — Encounter (INDEPENDENT_AMBULATORY_CARE_PROVIDER_SITE_OTHER): Payer: Self-pay | Admitting: Surgery

## 2023-09-02 ENCOUNTER — Ambulatory Visit (HOSPITAL_COMMUNITY)

## 2023-09-02 VITALS — BP 147/73 | HR 67 | Temp 97.6°F | Ht 66.0 in | Wt 190.0 lb

## 2023-09-02 DIAGNOSIS — R079 Chest pain, unspecified: Secondary | ICD-10-CM

## 2023-09-02 DIAGNOSIS — Z1331 Encounter for screening for depression: Secondary | ICD-10-CM | POA: Insufficient documentation

## 2023-09-02 DIAGNOSIS — M940 Chondrocostal junction syndrome [Tietze]: Secondary | ICD-10-CM | POA: Insufficient documentation

## 2023-09-02 LAB — CBC WITH DIFF
BASOPHIL #: 0 10*3/uL (ref 0.00–0.10)
BASOPHIL %: 1 % (ref 0–1)
EOSINOPHIL #: 0.4 10*3/uL (ref 0.00–0.50)
EOSINOPHIL %: 7 % (ref 1–7)
HCT: 39.7 % (ref 31.2–41.9)
HGB: 13.1 g/dL (ref 10.9–14.3)
LYMPHOCYTE #: 1.7 10*3/uL (ref 1.10–3.10)
LYMPHOCYTE %: 33 % (ref 16–46)
MCH: 28.7 pg (ref 24.7–32.8)
MCHC: 33 g/dL (ref 32.3–35.6)
MCV: 86.9 fL (ref 75.5–95.3)
MONOCYTE #: 0.4 10*3/uL (ref 0.20–0.90)
MONOCYTE %: 7 % (ref 4–11)
MPV: 9.3 fL (ref 7.9–10.8)
NEUTROPHIL #: 2.7 10*3/uL (ref 1.90–8.20)
NEUTROPHIL %: 53 % (ref 43–77)
PLATELETS: 221 10*3/uL (ref 140–440)
RBC: 4.57 10*6/uL (ref 3.63–4.92)
RDW: 14.6 % (ref 12.3–17.7)
WBC: 5.1 10*3/uL (ref 3.8–11.8)

## 2023-09-02 LAB — D-DIMER: D-DIMER: 442 ng{FEU}/mL (ref 215–500)

## 2023-09-02 MED ORDER — DOXYCYCLINE HYCLATE 100 MG CAPSULE
100.0000 mg | ORAL_CAPSULE | Freq: Two times a day (BID) | ORAL | 0 refills | Status: DC
Start: 2023-09-02 — End: 2023-10-18

## 2023-09-02 MED ORDER — BENZONATATE 200 MG CAPSULE
200.0000 mg | ORAL_CAPSULE | Freq: Three times a day (TID) | ORAL | 0 refills | Status: DC | PRN
Start: 2023-09-02 — End: 2023-11-27

## 2023-09-02 MED ORDER — ALBUTEROL SULFATE HFA 90 MCG/ACTUATION AEROSOL INHALER
2.0000 | INHALATION_SPRAY | Freq: Three times a day (TID) | RESPIRATORY_TRACT | 1 refills | Status: DC
Start: 2023-09-02 — End: 2023-11-27

## 2023-09-02 NOTE — Telephone Encounter (Signed)
-----   Message from Mills Alma, [place] sent at 09/02/2023  1:07 PM EDT -----  Regarding: RE: meds  Will you send pt. A message about  it, please  ----- Message -----  From: Claiborne Crew, LPN  Sent: 05/03/1094   1:05 PM EDT  To: Mills Alma, PA-C  Subject: FW: meds                                         Call pt. Pharmacy and they don't have a muscle relaxer on file for her and they go back 1 year and half.  ----- Message -----  From: Mills Alma, PA-C  Sent: 09/02/2023  12:04 PM EDT  To: Claiborne Crew, LPN  Subject: meds                                             Please call pts.pharmacy and ask them what muscle relaxer i prescribed pt. I can't find name? Thank you

## 2023-09-02 NOTE — Nursing Note (Signed)
 09/02/23 0832   Recent Weight Change   Have you had a recent unexplained weight loss or gain? N   Domestic Violence   Because we are aware of abuse and domestic violence today, we ask all patients: Are you being hurt, hit, or frightened by anyone at your home or in your life?  N   Basic Needs   Do you have any basic needs within your home that are not being met? (such as Food, Shelter, [employer] fast streamer, Tranportation, paying for bills and/or medications) N

## 2023-09-02 NOTE — Progress Notes (Unsigned)
 FAMILY MEDICINE, MEDICAL OFFICE BUILDING  [address]  [place] [address]  Operated by [facility]    Progress Note    Bonnie Kline  [birth year]  [id number]    Date of Service: 09/02/2023  8:30 AM EDT    Chief complaint:   Chief Complaint   Patient presents with    Cough     Notices coughing is more so when she wakes up     Chest Tightness     Has had this cough for about a week, feels like it is getting worse. Has some chest pressure when she coughs too. Denies any body aches, fever or GI symptoms        Subjective:     This is a case of a 73 y.o. year old female who comes in today for acute evaluation. Pt.states she's having this "deep harsh cough"for 1 week and seems to be worsening. No sputum, no fever or chills, sob.chest pain today mid-anterior. No nausea, no vomiting, no recent travel.  No diaphoresis. No meds OTC. Coughing fits no new meds or activity changes. No smoking.    Review of Systems see HPI    Current Outpatient Medications   Medication Sig    albuterol sulfate (PROVENTIL OR VENTOLIN OR PROAIR) 90 mcg/actuation Inhalation oral inhaler Take 2 Puffs by inhalation Three times a day for 14 days    Benzonatate (TESSALON) 200 mg Oral Capsule Take 1 Capsule (200 mg total) by mouth Three times a day as needed for Cough    buPROPion  (WELLBUTRIN ) 100 mg Oral Tablet Take 1 Tablet (100 mg total) by mouth Twice daily (Patient taking differently: Take 1 Tablet (100 mg total) by mouth Daily)    cyclobenzaprine (FLEXERIL) 10 mg Oral Tablet Take 1 Tablet (10 mg total) by mouth Twice per day as needed for Muscle spasms Indications: muscle spasm    diosmin complex no.1 (VASCULERA) 630 mg Oral Tablet Take 1 Tablet (630 mg total) by mouth Daily    doxycycline hyclate (VIBRAMYCIN) 100 mg Oral Capsule Take 1 Capsule (100 mg total) by mouth Twice daily for 10 days    FLUoxetine  (PROZAC ) 40 mg Oral Capsule Take 1 Capsule (40 mg total) by mouth Once a day    lamoTRIgine (LAMICTAL) 25 mg Oral Tablet  Take 1 Tablet (25 mg total) by mouth Daily    latanoprost (XALATAN) 0.005 % Ophthalmic Drops Instill 1 Drop into both eyes Once a day    Magnesium Glycinate 100 mg Oral Tablet Take 4 Tablets (400 mg total) by mouth Once a day    meloxicam  (MOBIC ) 7.5 mg Oral Tablet TAKE 1 TABLET(7.5 MG) BY MOUTH TWICE DAILY AS NEEDED FOR PAIN    multivitamin-minerals-lutein (MULTIVITAMIN 50 PLUS) Oral Tablet Take 1 Tablet by mouth Daily    pantoprazole  (PROTONIX ) 40 mg Oral Tablet, Delayed Release (E.C.) Take 1 Tablet (40 mg total) by mouth Daily    polyethylene glycol (MIRALAX ) 17 gram/dose Oral Powder Take 3 teaspoons (17 g total) by mouth Daily    pravastatin  (PRAVACHOL ) 20 mg Oral Tablet Take 1 Tablet (20 mg total) by mouth Daily    SIMBRINZA 1-0.2 % Ophthalmic Drops, Suspension Instill 1 Drop into both eyes Twice daily    solifenacin  (VESICARE ) 10 mg Oral Tablet Take 1 Tablet (10 mg total) by mouth Daily       Objective:     BP (!) 147/73   Pulse 67   Temp 36.4 C (97.6 F) (Temporal)   Ht  1.676 m (5\' 6" )   Wt 86.2 kg (190 lb)   SpO2 99%   BMI 30.67 kg/m     Physical Exam  Vitals and nursing note reviewed.   Constitutional:       General: She is not in acute distress.     Appearance: Normal appearance.   HENT:      Head: Normocephalic and atraumatic.      Right Ear: Tympanic membrane normal.      Left Ear: Tympanic membrane normal.      Nose: Congestion present.      Mouth/Throat:      Mouth: Mucous membranes are dry.   Eyes:      Extraocular Movements: Extraocular movements intact.      Conjunctiva/sclera: Conjunctivae normal.   Cardiovascular:      Rate and Rhythm: Normal rate and regular rhythm.      Heart sounds: Normal heart sounds.   Pulmonary:      Effort: Pulmonary effort is normal.      Breath sounds: Normal breath sounds.      Comments: Harsh cough noted in exam, tenderness noted left anterior intercostal spaces and mildly on right.  Skin:     General: Skin is warm and dry.   Neurological:      General: No  focal deficit present.      Mental Status: She is alert. Mental status is at baseline.   Psychiatric:         Mood and Affect: Mood normal.         Behavior: Behavior normal.           Assessment & Plan  Chest pain    Orders:    XR CHEST AP AND LATERAL; Future    CBC/DIFF; Future    D-DIMER; Future    Screening for depression         Costochondritis, acute  Check CXR and labs         The patient was given the opportunity to ask questions and those questions were answered to the patient's satisfaction. The patient was encouraged to call with any additional questions or concerns.    Follow [id number] pt.of results today      Mills Alma, PA-C

## 2023-09-02 NOTE — Telephone Encounter (Signed)
 Left vmail to return call.

## 2023-09-02 NOTE — Nursing Note (Signed)
 09/02/23 0832   PHQ 9 (follow up)   Little interest or pleasure in doing things. 0   Feeling down, depressed, or hopeless 0   Trouble falling or staying asleep, or sleeping too much. 0   Feeling tired or having little energy 2   Poor appetite or overeating 1   Feeling bad about yourself/ that you are a failure in the past 2 weeks? 0   Trouble concentrating on things in the past 2 weeks? 0   Moving/Speaking slowly or being fidgety or restless  in the past 2 weeks? 1   Thoughts that you would be better off DEAD, or of hurting yourself in some way. 0   If you checked off any problems, how difficult have these problems made it for you to do your work, take care of things at home, or get along with other people? Not difficult at all   PHQ 9 Total 4   Interpretation of Total Score No depression

## 2023-09-03 ENCOUNTER — Other Ambulatory Visit (INDEPENDENT_AMBULATORY_CARE_PROVIDER_SITE_OTHER): Payer: Self-pay | Admitting: PHYSICIAN ASSISTANT

## 2023-09-03 DIAGNOSIS — I517 Cardiomegaly: Secondary | ICD-10-CM

## 2023-09-03 MED ORDER — CYCLOBENZAPRINE 10 MG TABLET
10.0000 mg | ORAL_TABLET | Freq: Two times a day (BID) | ORAL | 0 refills | Status: AC | PRN
Start: 2023-09-03 — End: ?

## 2023-09-03 NOTE — Telephone Encounter (Signed)
Spoke with pt. Voiced understanding.

## 2023-09-07 ENCOUNTER — Other Ambulatory Visit: Payer: Self-pay

## 2023-09-07 ENCOUNTER — Ambulatory Visit
Admission: RE | Admit: 2023-09-07 | Discharge: 2023-09-07 | Disposition: A | Discharge status: Home / Self Care | Payer: Self-pay | Source: Ambulatory Visit | Attending: PHYSICIAN ASSISTANT | Admitting: PHYSICIAN ASSISTANT

## 2023-09-07 DIAGNOSIS — I517 Cardiomegaly: Secondary | ICD-10-CM | POA: Insufficient documentation

## 2023-09-07 LAB — TRANSTHORACIC ECHOCARDIOGRAM - ADULT: EF MEASUREMENT VALUE: 51.7

## 2023-09-09 ENCOUNTER — Ambulatory Visit (INDEPENDENT_AMBULATORY_CARE_PROVIDER_SITE_OTHER): Payer: Self-pay | Admitting: PHYSICIAN ASSISTANT

## 2023-09-22 ENCOUNTER — Ambulatory Visit (INDEPENDENT_AMBULATORY_CARE_PROVIDER_SITE_OTHER): Payer: Self-pay | Admitting: PHYSICIAN ASSISTANT

## 2023-10-14 ENCOUNTER — Ambulatory Visit: Payer: Self-pay | Attending: PHYSICIAN ASSISTANT | Admitting: PHYSICIAN ASSISTANT

## 2023-10-14 ENCOUNTER — Other Ambulatory Visit: Payer: Self-pay

## 2023-10-14 ENCOUNTER — Encounter (INDEPENDENT_AMBULATORY_CARE_PROVIDER_SITE_OTHER): Payer: Self-pay | Admitting: PHYSICIAN ASSISTANT

## 2023-10-14 VITALS — BP 127/72 | HR 74 | Temp 98.0°F | Resp 16 | Ht 66.0 in | Wt 189.8 lb

## 2023-10-14 DIAGNOSIS — M1990 Unspecified osteoarthritis, unspecified site: Secondary | ICD-10-CM | POA: Insufficient documentation

## 2023-10-14 DIAGNOSIS — M25562 Pain in left knee: Secondary | ICD-10-CM | POA: Insufficient documentation

## 2023-10-14 DIAGNOSIS — M25362 Other instability, left knee: Secondary | ICD-10-CM | POA: Insufficient documentation

## 2023-10-14 DIAGNOSIS — R053 Chronic cough: Secondary | ICD-10-CM | POA: Insufficient documentation

## 2023-10-14 MED ORDER — MONTELUKAST 10 MG TABLET
10.0000 mg | ORAL_TABLET | Freq: Every evening | ORAL | 0 refills | Status: DC
Start: 2023-10-14 — End: 2024-02-19

## 2023-10-14 MED ORDER — TRELEGY ELLIPTA 100 MCG-62.5 MCG-25 MCG POWDER FOR INHALATION
1.0000 | DISK | Freq: Every day | RESPIRATORY_TRACT | 2 refills | Status: DC
Start: 2023-10-14 — End: 2023-11-27

## 2023-10-14 MED ORDER — ALBUTEROL SULFATE HFA 90 MCG/ACTUATION AEROSOL INHALER
1.0000 | INHALATION_SPRAY | Freq: Four times a day (QID) | RESPIRATORY_TRACT | 1 refills | Status: DC | PRN
Start: 2023-10-14 — End: 2023-11-27

## 2023-10-14 NOTE — Nursing Note (Signed)
 10/14/23 1011   Health Education and Literacy   How often do you have a problem understanding what is told to you about your medical condition?  Never   Domestic Violence   Because we are aware of abuse and domestic violence today, we ask all patients: Are you being hurt, hit, or frightened by anyone at your home or in your life?  N   Basic Needs   Do you have any basic needs within your home that are not being met? (such as Food, Shelter, [employer] fast streamer, Tranportation, paying for bills and/or medications) N

## 2023-10-14 NOTE — Progress Notes (Signed)
 FAMILY MEDICINE, MEDICAL OFFICE BUILDING  [address]  [place] [address]  Operated by [facility]    Progress Note    LETONYA MANGELS  1950/11/07  [id number]    Date of Service: 10/14/2023 10:00 AM EDT    Chief complaint:   Chief Complaint   Patient presents with    Knee Pain     Left knee pain, will give out at times. Has been hurting for a couple weeks    Cough     Had for months. Not constant       Subjective:     This is a case of a 73 y.o. year old female who comes in today for eval with jamie. Pt.c/o left knee pain a dull ache constant. No falls or injuries. Pt.states it feels unstable and gives out.pops at times. Pt.states pain ongoing for 6 weeks. [facility] and has had normal xrays. Pt.wearing a knee brace for support. No hx of trauma knee. No activities changes. Pt.and dtr report a cough intermittently for months. Harsh cough, dry, no pattern. No environmental exposures.      Review of Systems   Constitutional:  Negative for diaphoresis, fatigue and fever.   HENT:  Positive for congestion.    Respiratory:  Positive for cough and chest tightness. Negative for apnea, choking, shortness of breath and wheezing.    Cardiovascular:  Negative for chest pain, palpitations and leg swelling.   Neurological:  Negative for dizziness, light-headedness and headaches.   Psychiatric/Behavioral: Negative.        Current Outpatient Medications   Medication Sig    albuterol  sulfate (PROVENTIL  OR VENTOLIN  OR PROAIR ) 90 mcg/actuation Inhalation oral inhaler Take 2 Puffs by inhalation Three times a day for 14 days    albuterol  sulfate (PROVENTIL  OR VENTOLIN  OR PROAIR ) 90 mcg/actuation Inhalation oral inhaler Take 1-2 Puffs by inhalation Every 6 hours as needed    Benzonatate  (TESSALON ) 200 mg Oral Capsule Take 1 Capsule (200 mg total) by mouth Three times a day as needed for Cough    buPROPion  (WELLBUTRIN ) 100 mg Oral Tablet Take 1 Tablet (100 mg total) by mouth Twice daily  (Patient taking differently: Take 1 Tablet (100 mg total) by mouth Daily)    cyclobenzaprine  (FLEXERIL ) 10 mg Oral Tablet Take 1 Tablet (10 mg total) by mouth Twice per day as needed for Muscle spasms Indications: muscle spasm    diosmin complex no.1 (VASCULERA) 630 mg Oral Tablet Take 1 Tablet (630 mg total) by mouth Daily    doxycycline  hyclate (VIBRAMYCIN ) 100 mg Oral Capsule Take 1 Capsule (100 mg total) by mouth Twice daily for 10 days    FLUoxetine  (PROZAC ) 40 mg Oral Capsule Take 1 Capsule (40 mg total) by mouth Once a day    fluticasone-umeclidin-vilanter (TRELEGY ELLIPTA ) 100-62.5-25 mcg Inhalation Disk with Device Take 1 Inhalation by inhalation Daily    lamoTRIgine (LAMICTAL) 25 mg Oral Tablet Take 1 Tablet (25 mg total) by mouth Daily    latanoprost (XALATAN) 0.005 % Ophthalmic Drops Instill 1 Drop into both eyes Once a day    Magnesium  Glycinate 100 mg Oral Tablet Take 4 Tablets (400 mg total) by mouth Once a day    meloxicam  (MOBIC ) 7.5 mg Oral Tablet TAKE 1 TABLET(7.5 MG) BY MOUTH TWICE DAILY AS NEEDED FOR PAIN    montelukast  (SINGULAIR ) 10 mg Oral Tablet Take 1 Tablet (10 mg total) by mouth Every evening    multivitamin-minerals-lutein (MULTIVITAMIN  50 PLUS) Oral Tablet Take 1 Tablet by mouth Daily    pantoprazole  (PROTONIX ) 40 mg Oral Tablet, Delayed Release (E.C.) Take 1 Tablet (40 mg total) by mouth Daily    polyethylene glycol (MIRALAX ) 17 gram/dose Oral Powder Take 3 teaspoons (17 g total) by mouth Daily    pravastatin  (PRAVACHOL ) 20 mg Oral Tablet Take 1 Tablet (20 mg total) by mouth Daily    SIMBRINZA 1-0.2 % Ophthalmic Drops, Suspension Instill 1 Drop into both eyes Twice daily    solifenacin  (VESICARE ) 10 mg Oral Tablet Take 1 Tablet (10 mg total) by mouth Daily       Objective:     BP 127/72   Pulse 74   Temp 36.7 C (98 F) (Temporal)   Resp 16   Ht 1.676 m (5' 6)   Wt 86.1 kg (189 lb 12.8 oz)   SpO2 94%   BMI 30.63 kg/m      Physical Exam  Vitals and nursing note reviewed.    Constitutional:       General: She is not in acute distress.     Appearance: Normal appearance.   HENT:      Head: Normocephalic and atraumatic.      Nose: Nose normal.   Cardiovascular:      Rate and Rhythm: Normal rate and regular rhythm.      Heart sounds: Normal heart sounds.   Pulmonary:      Effort: Pulmonary effort is normal.      Breath sounds: Normal breath sounds.      Comments: Harsh coughing noted at times, no distress  Musculoskeletal:         General: Tenderness present. No deformity.      Comments: Left knee with extensive crepitus with all ROM and and some instability with bearing weight. No calf edema or erythema, gait slow   Neurological:      General: No focal deficit present.      Mental Status: She is alert.      Gait: Gait normal.   Psychiatric:         Mood and Affect: Mood normal.         Thought Content: Thought content normal.           Assessment & Plan  Left knee pain    Orders:    MRI KNEE LEFT WO CONTRAST; Future    Knee instability, left    Orders:    MRI KNEE LEFT WO CONTRAST; Future    Osteoarthritis    Orders:    MRI KNEE LEFT WO CONTRAST; Future    Chronic cough  Review CXR 09/02/2023 at [facility] Singulair  10 mg 1 at bedtime  Trelegy 100 1 inhalation daily and rinse after use  Albuterol   inhaler 2-3 times a day   Schedule ct chest-discuss with patient daughter if cough resolves with tx plan they can call and we will cancel ct chest.  Orders:    CT LUNGS HIGH RESOLUTION W/WO IV CONTRAST; Future       Other orders    montelukast  (SINGULAIR ) 10 mg Oral Tablet; Take 1 Tablet (10 mg total) by mouth Every evening    albuterol  sulfate (PROVENTIL  OR VENTOLIN  OR PROAIR ) 90 mcg/actuation Inhalation oral inhaler; Take 1-2 Puffs by inhalation Every 6 hours as needed    fluticasone-umeclidin-vilanter (TRELEGY ELLIPTA ) 100-62.5-25 mcg Inhalation Disk with Device; Take 1 Inhalation by inhalation Daily          The patient was  given the opportunity to ask questions and those questions were  answered to the patient's satisfaction. The patient was encouraged to call with any additional questions or concerns.    Follow up: Return in about 6 weeks (around 11/25/2023).    Gerardine Ammon, PA-C

## 2023-10-19 ENCOUNTER — Other Ambulatory Visit (INDEPENDENT_AMBULATORY_CARE_PROVIDER_SITE_OTHER): Payer: Self-pay | Admitting: PHYSICIAN ASSISTANT

## 2023-10-20 NOTE — Telephone Encounter (Signed)
 Pt's daughter Kate calling stating they are currently at pt's appointment and she requested yesterday that notes and report  from April are faxed over, they have not received paperwork. Please refax.     Fax: [phone]  Call back: [phone]

## 2023-11-09 ENCOUNTER — Encounter (INDEPENDENT_AMBULATORY_CARE_PROVIDER_SITE_OTHER): Payer: Self-pay | Admitting: PHYSICIAN ASSISTANT

## 2023-11-09 ENCOUNTER — Other Ambulatory Visit (INDEPENDENT_AMBULATORY_CARE_PROVIDER_SITE_OTHER): Payer: Self-pay | Admitting: PHYSICIAN ASSISTANT

## 2023-11-09 ENCOUNTER — Telehealth (INDEPENDENT_AMBULATORY_CARE_PROVIDER_SITE_OTHER): Payer: Self-pay | Admitting: PHYSICIAN ASSISTANT

## 2023-11-09 DIAGNOSIS — M25562 Pain in left knee: Secondary | ICD-10-CM

## 2023-11-09 DIAGNOSIS — M25362 Other instability, left knee: Secondary | ICD-10-CM

## 2023-11-09 NOTE — Telephone Encounter (Signed)
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

## 2023-11-09 NOTE — Telephone Encounter (Signed)
 Called pt and verbally notified pt elects to see Dr. Cecilie. Referral order placed

## 2023-11-14 ENCOUNTER — Ambulatory Visit (HOSPITAL_COMMUNITY): Payer: Self-pay

## 2023-11-26 ENCOUNTER — Other Ambulatory Visit: Payer: Self-pay

## 2023-11-26 ENCOUNTER — Ambulatory Visit
Admission: RE | Admit: 2023-11-26 | Discharge: 2023-11-26 | Disposition: A | Discharge status: Home / Self Care | Payer: Self-pay | Source: Ambulatory Visit | Attending: PHYSICIAN ASSISTANT | Admitting: PHYSICIAN ASSISTANT

## 2023-11-26 ENCOUNTER — Other Ambulatory Visit (INDEPENDENT_AMBULATORY_CARE_PROVIDER_SITE_OTHER): Payer: Self-pay | Admitting: PHYSICIAN ASSISTANT

## 2023-11-26 DIAGNOSIS — M25362 Other instability, left knee: Secondary | ICD-10-CM

## 2023-11-26 DIAGNOSIS — R918 Other nonspecific abnormal finding of lung field: Secondary | ICD-10-CM

## 2023-11-26 DIAGNOSIS — M25562 Pain in left knee: Secondary | ICD-10-CM

## 2023-11-26 DIAGNOSIS — M1990 Unspecified osteoarthritis, unspecified site: Secondary | ICD-10-CM

## 2023-11-26 DIAGNOSIS — R053 Chronic cough: Secondary | ICD-10-CM

## 2023-11-26 MED ORDER — IOHEXOL 350 MG IODINE/ML INTRAVENOUS SOLUTION
75.0000 mL | INTRAVENOUS | Status: AC
Start: 2023-11-26 — End: 2023-11-26
  Administered 2023-11-26: 75 mL via INTRAVENOUS

## 2023-11-27 ENCOUNTER — Ambulatory Visit: Payer: Self-pay | Attending: PHYSICIAN ASSISTANT | Admitting: PHYSICIAN ASSISTANT

## 2023-11-27 ENCOUNTER — Ambulatory Visit (INDEPENDENT_AMBULATORY_CARE_PROVIDER_SITE_OTHER): Payer: Self-pay | Admitting: PHYSICIAN ASSISTANT

## 2023-11-27 ENCOUNTER — Encounter (INDEPENDENT_AMBULATORY_CARE_PROVIDER_SITE_OTHER): Payer: Self-pay | Admitting: PHYSICIAN ASSISTANT

## 2023-11-27 VITALS — BP 120/59 | HR 64 | Temp 97.8°F | Ht 66.0 in | Wt 190.6 lb

## 2023-11-27 DIAGNOSIS — M25562 Pain in left knee: Secondary | ICD-10-CM | POA: Insufficient documentation

## 2023-11-27 DIAGNOSIS — R4189 Other symptoms and signs involving cognitive functions and awareness: Secondary | ICD-10-CM | POA: Insufficient documentation

## 2023-11-27 DIAGNOSIS — R059 Cough, unspecified: Secondary | ICD-10-CM | POA: Insufficient documentation

## 2023-11-27 DIAGNOSIS — R143 Flatulence: Secondary | ICD-10-CM | POA: Insufficient documentation

## 2023-11-27 DIAGNOSIS — R9389 Abnormal findings on diagnostic imaging of other specified body structures: Secondary | ICD-10-CM | POA: Insufficient documentation

## 2023-11-27 NOTE — Nursing Note (Signed)
 11/27/23 0958   PHQ 9 (follow up)   Little interest or pleasure in doing things. 0   Feeling down, depressed, or hopeless 1   Trouble falling or staying asleep, or sleeping too much. 0   Feeling tired or having little energy 1   Poor appetite or overeating 0   Feeling bad about yourself/ that you are a failure in the past 2 weeks? 0   Trouble concentrating on things in the past 2 weeks? 1   Moving/Speaking slowly or being fidgety or restless  in the past 2 weeks? 0   Thoughts that you would be better off DEAD, or of hurting yourself in some way. 0   If you checked off any problems, how difficult have these problems made it for you to do your work, take care of things at home, or get along with other people? Not difficult at all   PHQ 9 Total 3   Interpretation of Total Score No depression

## 2023-11-27 NOTE — Progress Notes (Signed)
 FAMILY MEDICINE, MEDICAL OFFICE BUILDING  [address]  [place] [address]  Operated by [facility]    Progress Note    Bonnie Kline  12-29-1950  [id number]    Date of Service: 11/27/2023 10:00 AM EDT    Chief complaint:   Chief Complaint   Patient presents with    Follow Up     CT yesterday and MRI of knee recently too. Also has a question about vesicare  and would like to discuss something different to try.     Labs Only       Subjective:     This is a case of a 73 y.o. year old female who comes in today for follow up with daughters Kate and Jamie to recheck and review recent studies. Pt.saw Dr.Yee and had neck injection yesterday. She follows with them 2 weeks. Pt.states she continues to have a  she had ct chest. Pt.reports she didn't respond to the inhalers and didn't use. Pt.states cough once or twice a day and coarse wet cough Pt.quit smoking 1983. Pt.saw Dr.Vaught and no changes, and she sees Holli,psych, APP and had med changes recently. Pt.see orthoped 12/01/23 and psych 12/04/23.    Review of Systems   Constitutional: Negative.    HENT: Negative.     Respiratory:  Positive for cough. Negative for chest tightness, shortness of breath and wheezing.    Cardiovascular:  Negative for chest pain, palpitations and leg swelling.   Gastrointestinal:  Negative for abdominal distention, abdominal pain, blood in stool and nausea.        Pt.states the Miralax  didn't help and she continues to have a lot of gas and now more episodes.         Current Outpatient Medications   Medication Sig    buPROPion  (WELLBUTRIN ) 75 mg Oral Tablet Take 1 Tablet (75 mg total) by mouth Twice daily    busPIRone  (BUSPAR ) 10 mg Oral Tablet Take 1 Tablet (10 mg total) by mouth Twice daily    cyclobenzaprine  (FLEXERIL ) 10 mg Oral Tablet Take 1 Tablet (10 mg total) by mouth Twice per day as needed for Muscle spasms Indications: muscle spasm    diosmin complex no.1 (VASCULERA) 630 mg Oral Tablet Take 1 Tablet (630 mg total)  by mouth Daily    FLUoxetine  (PROZAC ) 40 mg Oral Capsule Take 1 Capsule (40 mg total) by mouth Once a day    lamoTRIgine (LAMICTAL) 25 mg Oral Tablet Take 1 Tablet (25 mg total) by mouth Daily    latanoprost (XALATAN) 0.005 % Ophthalmic Drops Instill 1 Drop into both eyes Once a day    Magnesium  Glycinate 100 mg Oral Tablet Take 4 Tablets (400 mg total) by mouth Once a day    meloxicam  (MOBIC ) 7.5 mg Oral Tablet TAKE 1 TABLET(7.5 MG) BY MOUTH TWICE DAILY AS NEEDED FOR PAIN    montelukast  (SINGULAIR ) 10 mg Oral Tablet Take 1 Tablet (10 mg total) by mouth Every evening    multivitamin-minerals-lutein (MULTIVITAMIN 50 PLUS) Oral Tablet Take 1 Tablet by mouth Daily    pantoprazole  (PROTONIX ) 40 mg Oral Tablet, Delayed Release (E.C.) Take 1 Tablet (40 mg total) by mouth Daily    pravastatin  (PRAVACHOL ) 20 mg Oral Tablet Take 1 Tablet (20 mg total) by mouth Daily    SIMBRINZA 1-0.2 % Ophthalmic Drops, Suspension Instill 1 Drop into both eyes Twice daily       Objective:     BP (!) 120/59   Pulse 64  Temp 36.6 C (97.8 F) (Temporal)   Ht 1.676 m (5' 6)   Wt 86.5 kg (190 lb 9.6 oz)   SpO2 97%   BMI 30.76 kg/m       BMI addressed: Intervention for BMI inappropriate due to age over 65 and comorbidities.     Physical Exam  Vitals and nursing note reviewed.   Constitutional:       Appearance: Normal appearance.   HENT:      Head: Normocephalic and atraumatic.   Cardiovascular:      Rate and Rhythm: Normal rate and regular rhythm.      Heart sounds: Normal heart sounds.   Pulmonary:      Effort: Pulmonary effort is normal.      Breath sounds: Normal breath sounds.   Abdominal:      General: Abdomen is flat. Bowel sounds are normal. There is no distension.      Palpations: Abdomen is soft.      Tenderness: There is no abdominal tenderness. There is no guarding.   Musculoskeletal:      Cervical back: Normal range of motion and neck supple. No tenderness.   Neurological:      General: No focal deficit present.      Mental  Status: She is alert. Mental status is at baseline.   Psychiatric:         Mood and Affect: Mood normal.         Behavior: Behavior normal.         Thought Content: Thought content normal.         Assessment & Plan  Abnormal CT of the chest  Review reports and gave pt.copy of report  Orders:    Referral to Pulmonary - [facility] [place]; Future    ANGIOTENSIN CONVERTING ENZYME (ACE), SERUM; Future    Cough  Review ct of chest  Orders:    Referral to Pulmonary - [facility] [place]; Future    ANGIOTENSIN CONVERTING ENZYME (ACE), SERUM; Future    Left knee pain  Review mri of knee with pt.and daughters  Scheduled with orthopedic 12/01/23       Flatulence  D/C diet coke and see if sx resolve         Pseudodementia  Review memory clinic notes   D/C Vesicare  due to concern with cognitive changes  If any urine issues with d/c Vesicare  call office               The patient was given the opportunity to ask questions and those questions were answered to the patient's satisfaction. The patient was encouraged to call with any additional questions or concerns.    Follow up: Return in about 6 weeks (around 01/08/2024), or 30 minutes follow up on pulmonologist visit.    Gerardine Ammon, PA-C

## 2023-11-27 NOTE — Nursing Note (Signed)
 11/27/23 9041   Domestic Violence   Because we are aware of abuse and domestic violence today, we ask all patients: Are you being hurt, hit, or frightened by anyone at your home or in your life?  N   Basic Needs   Do you have any basic needs within your home that are not being met? (such as Food, Shelter, [employer] fast [employer]er, Tranportation, paying for bills and/or medications) N

## 2023-11-29 LAB — ANGIOTENSIN CONVERTING ENZYME (ACE), SERUM: ANGIOTENSIN CONV ENZYME: 60 U/L (ref 9–67)

## 2023-11-30 ENCOUNTER — Ambulatory Visit (INDEPENDENT_AMBULATORY_CARE_PROVIDER_SITE_OTHER): Payer: Self-pay | Admitting: PHYSICIAN ASSISTANT

## 2023-12-07 ENCOUNTER — Other Ambulatory Visit (INDEPENDENT_AMBULATORY_CARE_PROVIDER_SITE_OTHER): Payer: Self-pay | Admitting: PHYSICIAN ASSISTANT

## 2023-12-07 ENCOUNTER — Encounter (INDEPENDENT_AMBULATORY_CARE_PROVIDER_SITE_OTHER): Payer: Self-pay | Admitting: PHYSICIAN ASSISTANT

## 2023-12-07 DIAGNOSIS — E213 Hyperparathyroidism, unspecified: Secondary | ICD-10-CM

## 2023-12-07 DIAGNOSIS — R413 Other amnesia: Secondary | ICD-10-CM

## 2023-12-07 DIAGNOSIS — R9389 Abnormal findings on diagnostic imaging of other specified body structures: Secondary | ICD-10-CM

## 2023-12-07 DIAGNOSIS — E785 Hyperlipidemia, unspecified: Secondary | ICD-10-CM

## 2023-12-08 ENCOUNTER — Ambulatory Visit: Attending: PHYSICIAN ASSISTANT

## 2023-12-08 ENCOUNTER — Other Ambulatory Visit: Payer: Self-pay

## 2023-12-08 DIAGNOSIS — R413 Other amnesia: Secondary | ICD-10-CM | POA: Insufficient documentation

## 2023-12-08 DIAGNOSIS — E213 Hyperparathyroidism, unspecified: Secondary | ICD-10-CM | POA: Insufficient documentation

## 2023-12-08 DIAGNOSIS — E785 Hyperlipidemia, unspecified: Secondary | ICD-10-CM | POA: Insufficient documentation

## 2023-12-08 LAB — BASIC METABOLIC PANEL
ANION GAP: 5 mmol/L (ref 4–13)
BUN/CREA RATIO: 15 (ref 6–22)
BUN: 13 mg/dL (ref 7–25)
CALCIUM: 9.5 mg/dL (ref 8.6–10.3)
CHLORIDE: 108 mmol/L — ABNORMAL HIGH (ref 98–107)
CO2 TOTAL: 26 mmol/L (ref 21–31)
CREATININE: 0.86 mg/dL (ref 0.60–1.30)
ESTIMATED GFR: 71 mL/min/1.73mˆ2 (ref >59)
GLUCOSE: 103 mg/dL (ref 74–109)
OSMOLALITY, CALCULATED: 278 mosm/kg (ref 270–290)
POTASSIUM: 4.2 mmol/L (ref 3.5–5.1)
SODIUM: 139 mmol/L (ref 136–145)

## 2023-12-08 LAB — PARATHYROID HORMONE (PTH): PTH: 70.5 pg/mL (ref 12.0–88.0)

## 2023-12-08 LAB — THYROID STIMULATING HORMONE (SENSITIVE TSH): TSH: 1.876 u[IU]/mL (ref 0.450–5.330)

## 2023-12-10 ENCOUNTER — Ambulatory Visit (INDEPENDENT_AMBULATORY_CARE_PROVIDER_SITE_OTHER): Payer: Self-pay | Admitting: PHYSICIAN ASSISTANT

## 2023-12-16 ENCOUNTER — Other Ambulatory Visit (INDEPENDENT_AMBULATORY_CARE_PROVIDER_SITE_OTHER): Payer: Self-pay | Admitting: PHYSICIAN ASSISTANT

## 2023-12-16 DIAGNOSIS — E785 Hyperlipidemia, unspecified: Secondary | ICD-10-CM

## 2023-12-16 DIAGNOSIS — K219 Gastro-esophageal reflux disease without esophagitis: Secondary | ICD-10-CM

## 2023-12-16 MED ORDER — PRAVASTATIN 20 MG TABLET
20.0000 mg | ORAL_TABLET | Freq: Every day | ORAL | 0 refills | Status: DC
Start: 2023-12-16 — End: 2024-02-19

## 2023-12-16 MED ORDER — PANTOPRAZOLE 40 MG TABLET,DELAYED RELEASE
40.0000 mg | DELAYED_RELEASE_TABLET | Freq: Every day | ORAL | 0 refills | Status: DC
Start: 2023-12-16 — End: 2024-02-23

## 2023-12-18 ENCOUNTER — Other Ambulatory Visit: Payer: Self-pay

## 2023-12-18 ENCOUNTER — Ambulatory Visit: Attending: PHYSICIAN ASSISTANT

## 2023-12-18 ENCOUNTER — Other Ambulatory Visit (INDEPENDENT_AMBULATORY_CARE_PROVIDER_SITE_OTHER): Payer: Self-pay | Admitting: PHYSICIAN ASSISTANT

## 2023-12-18 ENCOUNTER — Encounter (INDEPENDENT_AMBULATORY_CARE_PROVIDER_SITE_OTHER): Payer: Self-pay | Admitting: PHYSICIAN ASSISTANT

## 2023-12-18 DIAGNOSIS — R399 Unspecified symptoms and signs involving the genitourinary system: Secondary | ICD-10-CM

## 2023-12-18 DIAGNOSIS — R410 Disorientation, unspecified: Secondary | ICD-10-CM

## 2023-12-18 LAB — BASIC METABOLIC PANEL
ANION GAP: 5 mmol/L (ref 4–13)
BUN/CREA RATIO: 21 (ref 6–22)
BUN: 17 mg/dL (ref 7–25)
CALCIUM: 9.5 mg/dL (ref 8.6–10.3)
CHLORIDE: 107 mmol/L (ref 98–107)
CO2 TOTAL: 27 mmol/L (ref 21–31)
CREATININE: 0.81 mg/dL (ref 0.60–1.30)
ESTIMATED GFR: 76 mL/min/1.73mˆ2 (ref >59)
GLUCOSE: 84 mg/dL (ref 74–109)
OSMOLALITY, CALCULATED: 278 mosm/kg (ref 270–290)
POTASSIUM: 4.3 mmol/L (ref 3.5–5.1)
SODIUM: 139 mmol/L (ref 136–145)

## 2023-12-18 LAB — URINALYSIS, MACROSCOPIC
BILIRUBIN: NEGATIVE mg/dL
BLOOD: NEGATIVE mg/dL
GLUCOSE: NEGATIVE mg/dL
KETONES: NEGATIVE mg/dL
LEUKOCYTES: NEGATIVE WBCs/uL
NITRITE: NEGATIVE
PH: 6 (ref 5.0–9.0)
PROTEIN: NEGATIVE mg/dL
SPECIFIC GRAVITY: 1.008 (ref 1.002–1.030)
UROBILINOGEN: NORMAL mg/dL

## 2023-12-18 LAB — URINALYSIS, MICROSCOPIC
RBCS: <1 /HPF (ref <4)
SQUAMOUS EPITHELIAL: 1 /HPF (ref <28)
WBCS: <1 /HPF (ref <6)

## 2023-12-18 MED ORDER — FLUCONAZOLE 150 MG TABLET
150.0000 mg | ORAL_TABLET | ORAL | 0 refills | Status: DC
Start: 2023-12-18 — End: 2024-01-08

## 2023-12-20 ENCOUNTER — Ambulatory Visit (INDEPENDENT_AMBULATORY_CARE_PROVIDER_SITE_OTHER): Payer: Self-pay | Admitting: PHYSICIAN ASSISTANT

## 2023-12-31 ENCOUNTER — Ambulatory Visit (INDEPENDENT_AMBULATORY_CARE_PROVIDER_SITE_OTHER): Payer: Self-pay | Admitting: PULMONARY DISEASE

## 2024-01-08 ENCOUNTER — Ambulatory Visit: Payer: Self-pay | Attending: PHYSICIAN ASSISTANT | Admitting: PHYSICIAN ASSISTANT

## 2024-01-08 ENCOUNTER — Other Ambulatory Visit: Payer: Self-pay

## 2024-01-08 ENCOUNTER — Encounter (INDEPENDENT_AMBULATORY_CARE_PROVIDER_SITE_OTHER): Payer: Self-pay | Admitting: PHYSICIAN ASSISTANT

## 2024-01-08 VITALS — BP 135/66 | HR 68 | Temp 98.3°F | Ht 66.0 in | Wt 191.0 lb

## 2024-01-08 DIAGNOSIS — M62838 Other muscle spasm: Secondary | ICD-10-CM | POA: Insufficient documentation

## 2024-01-08 DIAGNOSIS — F419 Anxiety disorder, unspecified: Secondary | ICD-10-CM | POA: Insufficient documentation

## 2024-01-08 DIAGNOSIS — R053 Chronic cough: Secondary | ICD-10-CM | POA: Insufficient documentation

## 2024-01-08 DIAGNOSIS — M47812 Spondylosis without myelopathy or radiculopathy, cervical region: Secondary | ICD-10-CM | POA: Insufficient documentation

## 2024-01-08 DIAGNOSIS — M542 Cervicalgia: Secondary | ICD-10-CM | POA: Insufficient documentation

## 2024-01-08 NOTE — Nursing Note (Signed)
 01/08/24 1040   Domestic Violence   Because we are aware of abuse and domestic violence today, we ask all patients: Are you being hurt, hit, or frightened by anyone at your home or in your life?  N   Basic Needs   Do you have any basic needs within your home that are not being met? (such as Food, Shelter, [employer] fast streamer, Tranportation, paying for bills and/or medications) N

## 2024-01-08 NOTE — Nursing Note (Signed)
 01/08/24 1042   Fall Risk Assessment   Do you feel unsteady when standing or walking? Yes  (sometimes)   Do you worry about falling? No   Have you fallen in the past year? No

## 2024-01-08 NOTE — Nursing Note (Signed)
 01/08/24 1040   Depression Screen   Little interest or pleasure in doing things. 1   Feeling down, depressed, or hopeless 1   PHQ 2 Total 2

## 2024-01-08 NOTE — Progress Notes (Signed)
 FAMILY MEDICINE, MEDICAL OFFICE BUILDING  [address]  [place] [address]  Operated by [facility]    Progress Note    Bonnie Kline  August 02, 1950  [id number]    Date of Service: 01/08/2024 10:30 AM EDT    Chief complaint:   Chief Complaint   Patient presents with    Follow Up 4 Weeks    Ear Pain     Continuous pain behind the right ear describes pain as coming and going. Rates the pain as 5/10.     Mammogram Order     Patient states that [facility] [place]        Subjective:     This is a case of a 74 y.o. year old female who comes in today for follow up with dtr Jill. Pt.states she d/c psych meds except Prozac . Pt.had gene testing and follow up next week.   Pt.c/o pain behind right ear for months from neck. She had neck ablation and sees Dr.Yee 01/26/24, and pulmo regarding CT chest 01/19/24.Pt.had injection of knee and it helped knee. No chest pain, cough continues, no n,v,d.      Current Outpatient Medications   Medication Sig    cyclobenzaprine  (FLEXERIL ) 10 mg Oral Tablet Take 1 Tablet (10 mg total) by mouth Twice per day as needed for Muscle spasms Indications: muscle spasm    diosmin complex no.1 (VASCULERA) 630 mg Oral Tablet Take 1 Tablet (630 mg total) by mouth Daily    FLUoxetine  (PROZAC ) 40 mg Oral Capsule Take 1 Capsule (40 mg total) by mouth Once a day    latanoprost (XALATAN) 0.005 % Ophthalmic Drops Instill 1 Drop into both eyes Once a day    Magnesium  Glycinate 100 mg Oral Tablet Take 4 Tablets (400 mg total) by mouth Once a day    meloxicam  (MOBIC ) 7.5 mg Oral Tablet TAKE 1 TABLET(7.5 MG) BY MOUTH TWICE DAILY AS NEEDED FOR PAIN    montelukast  (SINGULAIR ) 10 mg Oral Tablet Take 1 Tablet (10 mg total) by mouth Every evening    multivitamin-minerals-lutein (MULTIVITAMIN 50 PLUS) Oral Tablet Take 1 Tablet by mouth Daily    pantoprazole  (PROTONIX ) 40 mg Oral Tablet, Delayed Release (E.C.) Take 1 Tablet (40 mg total) by mouth Daily     pravastatin  (PRAVACHOL ) 20 mg Oral Tablet Take 1 Tablet (20 mg total) by mouth Daily    SIMBRINZA 1-0.2 % Ophthalmic Drops, Suspension Instill 1 Drop into both eyes Twice daily       Objective:     BP 135/66   Pulse 68   Temp 36.8 C (98.3 F) (Temporal)   Ht 1.676 m (5' 6)   Wt 86.6 kg (191 lb)   SpO2 96%   BMI 30.83 kg/m     Physical Exam  Vitals and nursing note reviewed.   Constitutional:       Appearance: Normal appearance.   Cardiovascular:      Rate and Rhythm: Normal rate and regular rhythm.      Heart sounds: Normal heart sounds.   Pulmonary:      Effort: Pulmonary effort is normal.      Breath sounds: Normal breath sounds.   Musculoskeletal:         General: Tenderness present.      Cervical back: Normal range of motion. Tenderness present.      Comments: Right side of neck anterior and posterior side with tenderness at scalene areas, no  mastoid tenderness noted.    Neurological:      General: No focal deficit present.      Mental Status: She is alert. Mental status is at baseline.   Psychiatric:      Comments: Pt.anxious at times during visit.         Assessment & Plan  Anxiety  Pt.to see [facility]   Pt.will bring us  a copy of the gene testing to follow up  Orders:    Referral to External Provider    Neck pain  Continue with specialist  Flexeril  prn       Neck muscle spasm  Pt.to have a massage therapy  Continue HEP as directed.        Chronic cough  Proceed with pulmo appt. 01/19/24                The patient was given the opportunity to ask questions and those questions were answered to the patient's satisfaction. The patient was encouraged to call with any additional questions or concerns.    Follow up: Return in about 6 weeks (around 02/19/2024), or 30 minute after her referral appts..    Solace Wendorff Nelson-Jones, PA-C

## 2024-01-19 ENCOUNTER — Encounter (INDEPENDENT_AMBULATORY_CARE_PROVIDER_SITE_OTHER): Payer: Self-pay | Admitting: PULMONARY DISEASE

## 2024-01-19 ENCOUNTER — Other Ambulatory Visit: Payer: Self-pay

## 2024-01-19 ENCOUNTER — Ambulatory Visit: Payer: Self-pay | Attending: SLEEP MEDICINE | Admitting: PULMONARY DISEASE

## 2024-01-19 VITALS — BP 109/57 | HR 63 | Temp 97.5°F | Resp 18 | Ht 66.0 in | Wt 188.0 lb

## 2024-01-19 DIAGNOSIS — E6609 Other obesity due to excess calories: Secondary | ICD-10-CM | POA: Insufficient documentation

## 2024-01-19 DIAGNOSIS — R918 Other nonspecific abnormal finding of lung field: Secondary | ICD-10-CM | POA: Insufficient documentation

## 2024-01-19 DIAGNOSIS — R053 Chronic cough: Secondary | ICD-10-CM | POA: Insufficient documentation

## 2024-01-19 DIAGNOSIS — Z87891 Personal history of nicotine dependence: Secondary | ICD-10-CM | POA: Insufficient documentation

## 2024-01-19 DIAGNOSIS — E66811 Obesity, class 1: Secondary | ICD-10-CM | POA: Insufficient documentation

## 2024-01-19 DIAGNOSIS — Z6830 Body mass index (BMI) 30.0-30.9, adult: Secondary | ICD-10-CM | POA: Insufficient documentation

## 2024-01-19 DIAGNOSIS — K219 Gastro-esophageal reflux disease without esophagitis: Secondary | ICD-10-CM

## 2024-01-19 NOTE — H&P (Signed)
 PULMONARY, [facility] PULMONOLOGY  [address]  [place] [address]  Operated by [facility]     New Patient    Patient Name: Bonnie Kline  Date: 01/19/2024  Department:  PULMONARY, [facility] PULMONOLOGY  MRN: [id number]  DOB: 1950-09-10  Primary Care Provider:  Gerardine Ammon, PA-C  Referring Provider:  No ref. provider found      Chief Complaint:   Chief Complaint   Patient presents with    Establish Care     Pt presents to establish sleep and pulmonary care. She was referred to the clinic by her PCP due to cough and abnormal chest CT.  She has a history of OSA requiring CPAP.  She is a previous pt of [facility] but would like to change practices. DME is [employer]. Therapy and compliance reports has been requested.      There are no exam notes on file for this visit.      History of Present Illness:   Bonnie Kline is a 73 y.o., White female presents today for lung nodules.    She is a former smoker.  One max 1 ppd X 30 years.  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    Denies any personal family history of chronic lung disease.    History of SVT.  History of colon cancer in 2015 which was resected.  She reports having a chronic dry cough going back for the last several months.  She has been tried on inhalers as well as Singulair  and antibiotics.  Nothing seems to really alleviate or exacerbate.    A.m. equals p.m. symptoms.  Not really affected by meals.  Denies sinus drainage.  She is on Protonix  for gastroesophageal reflux disease.  She had a high-resolution CT chest ordered for this cough.  There was no ILD however there were a few small tiny scattered nodules.  Breathing is good and denies any wheezing or shortness breath with routine activity.    No chest pain, pressure, tightness.      Past Surgical History  Past Surgical History:   Procedure Laterality Date    COLONOSCOPY      ESOPHAGOGASTRODUODENOSCOPY      HX CARDIAC ABLATION  2013    HX  CARPAL TUNNEL RELEASE Left 2010    HX COLON SURGERY (ANY)      Colon resection- due to colon cancer    HX TAH AND BSO  1998    ORTHOPEDIC SURGERY      right ankle    PARATHYROID  GLAND SURGERY      RADIOFREQUENCY ABLATION      Cervical spine         Medication List  Current Outpatient Medications   Medication Sig    cyclobenzaprine  (FLEXERIL ) 10 mg Oral Tablet Take 1 Tablet (10 mg total) by mouth Twice per day as needed for Muscle spasms Indications: muscle spasm    diosmin complex no.1 (VASCULERA) 630 mg Oral Tablet Take 1 Tablet (630 mg total) by mouth Daily    FLUoxetine  (PROZAC ) 40 mg Oral Capsule Take 1 Capsule (40 mg total) by mouth Once a day    latanoprost (XALATAN) 0.005 % Ophthalmic Drops Instill 1 Drop into both eyes Once a day    Magnesium  Glycinate 100 mg Oral Tablet Take 4 Tablets (400 mg total) by mouth Once a day    meloxicam  (MOBIC ) 7.5 mg Oral Tablet TAKE 1 TABLET(7.5 MG) BY MOUTH TWICE DAILY  AS NEEDED FOR PAIN    montelukast  (SINGULAIR ) 10 mg Oral Tablet Take 1 Tablet (10 mg total) by mouth Every evening    multivitamin-minerals-lutein (MULTIVITAMIN 50 PLUS) Oral Tablet Take 1 Tablet by mouth Daily    pantoprazole  (PROTONIX ) 40 mg Oral Tablet, Delayed Release (E.C.) Take 1 Tablet (40 mg total) by mouth Daily    pravastatin  (PRAVACHOL ) 20 mg Oral Tablet Take 1 Tablet (20 mg total) by mouth Daily    SIMBRINZA 1-0.2 % Ophthalmic Drops, Suspension Instill 1 Drop into both eyes Twice daily     Allergy List  Allergy History as of 01/19/24       AMOXICILLIN         Noted Status Severity Type Reaction    07/12/21 1216 Tibbs, Katie, LPN 96/82/76 Active Medium  Rash              CLAVULANIC ACID         Noted Status Severity Type Reaction    12/02/21 1405 Clarisa Service, RN 07/12/21 Active Medium  Rash    07/12/21 1217 Tibbs, Katie, LPN 96/82/76 Active                 PENICILLINS         Noted Status Severity Type Reaction    07/12/21 1217 Tibbs, Katie, LPN 96/82/76 Active Medium  Rash              POTASSIUM  CLAVULANATE         Noted Status Severity Type Reaction    06/24/23 1041 [place], [place], [place] 02/02/23 Active Low  Itching                  Family History   Family Medical History:       Problem Relation (Age of Onset)    Alzheimer's/Dementia Father    Atherosclerosis Mother    Breast Cancer Sister    Diabetes Daughter    Emphysema Mother    Heart Disease Father, Brother, Sister    Heart Surgery Brother    Hypertension (High Blood Pressure) Father    Pacemaker Sister            Social History  Social History     Socioeconomic History    Marital status: Widowed   Tobacco Use    Smoking status: Former     Current packs/day: 0.00     Average packs/day: 1 pack/day for 16.0 years (16.0 ttl pk-yrs)     Types: Cigarettes     Start date: 30     Quit date: 52     Years since quitting: 42.7     Passive exposure: Past    Smokeless tobacco: Never   Vaping Use    Vaping status: Never Used   Substance and Sexual Activity    Alcohol use: Never    Drug use: Never        Review of system  General:  Denies fever, chills, night sweats, fatigue, loss of appetite.  Neurological:  Denies headache, snoring.   Gastrointestinal:  Denies reflux, heartburn, diarrhea.  Cardiovascular:  Denies chest pain, irregular heart beats.  Respiratory:  Denies shortness of breath, cough, hemoptysis, anesthesia problems, wheezing, asbestos exposure.  Musculoskeletal:  Denies joint pain, restless legs.  Endocrine/Metabolic:  Denies weight gain, weight loss.  Immunologic:  Denies sinus allergy symptoms.  Mental Status/Psychiatric:  Denies anxiety, depression.  ENT:  Denies nasal congestion, hoarseness, postnasal drip.  Integumentary:  Denies rash, new skin lesions.  Vital Signs  Vitals:    01/19/24 1302   BP: (!) 109/57   Pulse: 63   Resp: 18   Temp: 36.4 C (97.5 F)   TempSrc: Oral   SpO2: 94%   Weight: 85.3 kg (188 lb)   Height: 1.676 m (5' 6)   BMI: 30.34         PHYSICAL EXAMINATION:   Constitutional:  Vital signs stable.  General appearance of the  patient:  Alert, no acute distress.  Normal appearance, well nourished.  Eyes: PERRLA and normal eye lids.  Conjunctiva normal.  Ears, Nose, Mouth, and Throat: External inspection of ears and nose with normal appearance.  Inspection of lips, teeth and gums with normal appearance and oral mucosa normal.  Neck: Supple with trachea midline, non tender, no nodules, no masses, gland position midline.  Respiratory:  Auscultation of lungs with normal breath sounds, no rales, no rhonchi, no wheezing.  Respiratory effort with no tractions, breathing regular and unlabored.  Cardiovascular:  Regular rhythm and regular rate.  No murmur, no peripheral edema.  Gastrointestinal: Abdomen non-tender, no masses, no hepatomegaly present.  Lymphatic:  No lymphadenopathy present, no supraclavicular nodes.  Musculoskeletal:  Normal gait and station, normal digits, no digital cyanosis or clubbing.  Mental Status/Psychiatric:  Alert, grossly oriented to person, place, and time.  Appropriate and normal mood.    Imaging:  No results found for this or any previous visit (from the past [id number] hours).    Assessment    ICD-10-CM    1. Pulmonary nodules  R91.8 CT CHEST WO IV CONTRAST     PULMONARY FUNCTION TESTING      2. Former smoker  Z87.891 CT CHEST WO IV CONTRAST     PULMONARY FUNCTION TESTING      3. Class 1 obesity due to excess calories without serious comorbidity with body mass index (BMI) of 30.0 to 30.9 in adult  E66.811 CT CHEST WO IV CONTRAST    E66.09 PULMONARY FUNCTION TESTING    Z68.30       4. Chronic cough  R05.3 CT CHEST WO IV CONTRAST     PULMONARY FUNCTION TESTING              Plan  Instructions  Continue medications as prescribed/directed unless changed by provider.  Plan of care discussed with patient.  Continue Protonix .    Continue Singulair .    Check PFT today.    Repeat CT chest in 6 months.    Will give pneumonia vaccine in office today.    Encourage RSV vaccine when able.    Seasonal flu vaccine in the  fall.  Encourage weight loss.    Return in about 6 months (around 07/18/2024) for with mid-level, with CT, PFT today.  The patient was given the opportunity to ask questions and those questions were answered to the patient's satisfaction. The patient was encouraged to call with any additional questions or concerns. Discussed with the patient effects and side effects of medications. Medication safety was discussed.  The patient was informed to contact the office within 7 business days if a message/lab results/referral/imaging results have not been conveyed to the patient.    Electronically signed by Manus Free, DO  Pulmonary and Critical care    This note may have been partially generated using MModal Fluency Direct system, and there may be some incorrect words, spellings, and punctuation that were not noted in checking the note before saving.

## 2024-01-27 ENCOUNTER — Other Ambulatory Visit: Payer: Self-pay

## 2024-01-27 ENCOUNTER — Ambulatory Visit: Payer: Self-pay | Attending: PHYSICIAN ASSISTANT | Admitting: PHYSICIAN ASSISTANT

## 2024-01-27 ENCOUNTER — Encounter (INDEPENDENT_AMBULATORY_CARE_PROVIDER_SITE_OTHER): Payer: Self-pay | Admitting: PHYSICIAN ASSISTANT

## 2024-01-27 VITALS — BP 112/67 | HR 64 | Temp 98.1°F | Resp 16 | Ht 66.0 in | Wt 188.8 lb

## 2024-01-27 DIAGNOSIS — H6091 Unspecified otitis externa, right ear: Secondary | ICD-10-CM | POA: Insufficient documentation

## 2024-01-27 DIAGNOSIS — M542 Cervicalgia: Secondary | ICD-10-CM | POA: Insufficient documentation

## 2024-01-27 DIAGNOSIS — G8929 Other chronic pain: Secondary | ICD-10-CM | POA: Insufficient documentation

## 2024-01-27 MED ORDER — OFLOXACIN 0.3 % EAR DROPS
5.0000 [drp] | Freq: Two times a day (BID) | OTIC | 0 refills | Status: DC
Start: 2024-01-27 — End: 2024-02-19

## 2024-01-27 NOTE — Progress Notes (Signed)
 FAMILY MEDICINE, MEDICAL OFFICE BUILDING  [address]  [place] [address]  Operated by [facility]    Bonnie Kline  18-Mar-1951  [id number]    Date of Service: 01/27/2024 11:00 AM EDT    Chief complaint:   Chief Complaint   Patient presents with    Ear Pain     Had a procedure on neck about 1 month ago. Not sure if her ear pain is connected to that but wants to be checked.       Subjective:      73 y.o. year old female who comes in today for intermittent ear pain x 3 months. Denies ear drainage, dizziness. She does wear hearing aids but doesn't have them in today because one requires repair. She states the pain is in her neck and radiates upward.   Has had neck injections and ablation without improvement of neck pain. She is considering massage therapy.     ROS:  Reviewed as negative unless otherwise mentioned in the HPI.    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    Supraventricular arrhythmia 07/18/2011    Palpitations 07/07/2011       Past Surgical History:   Procedure Laterality Date    COLONOSCOPY      ESOPHAGOGASTRODUODENOSCOPY      HX CARDIAC ABLATION  2013    HX CARPAL TUNNEL RELEASE Left 2010    HX COLON SURGERY (ANY)      Colon resection- due to colon cancer    HX TAH AND BSO  1998    ORTHOPEDIC SURGERY      right ankle    PARATHYROID  GLAND SURGERY       RADIOFREQUENCY ABLATION      Cervical spine           Current Outpatient Medications   Medication Sig    cyclobenzaprine  (FLEXERIL ) 10 mg Oral Tablet Take 1 Tablet (10 mg total) by mouth Twice per day as needed for Muscle spasms Indications: muscle spasm    diosmin complex no.1 (VASCULERA) 630 mg Oral Tablet Take 1 Tablet (630 mg total) by mouth Daily    FLUoxetine  (PROZAC ) 20 mg Oral Capsule Take 1 Capsule (20 mg total) by mouth Daily    FLUoxetine  (PROZAC ) 40 mg Oral Capsule Take 1 Capsule (40 mg total) by mouth Once a day    latanoprost (XALATAN) 0.005 % Ophthalmic Drops Instill 1 Drop into both eyes Once a day    Magnesium  Glycinate 100 mg Oral Tablet Take 4 Tablets (400 mg total) by mouth Once a day    meloxicam  (MOBIC ) 7.5 mg Oral Tablet TAKE 1 TABLET(7.5 MG) BY MOUTH TWICE DAILY AS NEEDED FOR PAIN    montelukast  (SINGULAIR ) 10 mg Oral Tablet Take 1 Tablet (10 mg total) by mouth Every evening  multivitamin-minerals-lutein (MULTIVITAMIN 50 PLUS) Oral Tablet Take 1 Tablet by mouth Daily    ofloxacin (FLOXIN) 0.3 % Otic Drops Otic Solution Administer 5 Drops into the right ear Twice daily for 7 days    pantoprazole  (PROTONIX ) 40 mg Oral Tablet, Delayed Release (E.C.) Take 1 Tablet (40 mg total) by mouth Daily    pravastatin  (PRAVACHOL ) 20 mg Oral Tablet Take 1 Tablet (20 mg total) by mouth Daily    SIMBRINZA 1-0.2 % Ophthalmic Drops, Suspension Instill 1 Drop into both eyes Twice daily    solifenacin  (VESICARE ) 10 mg Oral Tablet Take 1 Tablet (10 mg total) by mouth Daily       Objective:     BP 112/67   Pulse 64   Temp 36.7 C (98.1 F) (Temporal)   Resp 16   Ht 1.676 m (5' 6)   Wt 85.6 kg (188 lb 12.8 oz)   SpO2 94%   BMI 30.47 kg/m     General appearance: alert, cooperative, in no acute distress  Head: normocephalic, atraumatic.   Eyes: PERRL, EOMI.   ENT: Ears:  TMs intact bilaterally. Very small amount of cerumen in the R canal and mild erythema. Nose: patent. No rhinorrhea. Throat:  non-erythematous w/o lesion, moist mucous membranes.  Neck: mild posterior tenderness to palpation  Lungs: clear to auscultation bilaterally; no wheezes or rhonchi   Heart: regular rate and rhythm    Assessment/Plan     Assessment & Plan  Otitis externa of right ear, unspecified chronicity, unspecified type  Mild erythema noted of the ear canal rxed drops but discussed pain is likely coming from her neck.   Orders:    ofloxacin (FLOXIN) 0.3 % Otic Drops Otic Solution; Administer 5 Drops into the right ear Twice daily for 7 days    Chronic neck pain  Encouraged patient to pursue massage and/or physical therapy since she did not see benefit from the injections.                         The patient was given the opportunity to ask questions and those questions were answered to the patient's satisfaction. The patient was encouraged to call with any additional questions or concerns. Instructed patient to call back if symptoms worse.   Discussed with patient effects and side effects of medications. Medication safety was discussed. A copy of the patient's medication list was printed and given to the patient. A good faith effort was made to reconcile the patient's medications.       Follow up: Return if symptoms worsen or fail to improve.    Derrek Patricia, PA-C

## 2024-01-27 NOTE — Nursing Note (Signed)
 01/27/24 1103   Domestic Violence   Because we are aware of abuse and domestic violence today, we ask all patients: Are you being hurt, hit, or frightened by anyone at your home or in your life?  N   Basic Needs   Do you have any basic needs within your home that are not being met? (such as Food, Shelter, [employer] fast [employer]er, Tranportation, paying for bills and/or medications) N

## 2024-02-08 ENCOUNTER — Telehealth (INDEPENDENT_AMBULATORY_CARE_PROVIDER_SITE_OTHER): Payer: Self-pay | Admitting: PHYSICIAN ASSISTANT

## 2024-02-08 NOTE — Telephone Encounter (Signed)
-----   Message from Gerardine Ammon, PA-C sent at 02/08/2024 11:14 AM EDT -----  Regarding: mammo  Mammogram benign, recheck mammogram 1 year    Hard copy in EHR

## 2024-02-08 NOTE — Telephone Encounter (Signed)
 Letter mailed

## 2024-02-17 ENCOUNTER — Other Ambulatory Visit (INDEPENDENT_AMBULATORY_CARE_PROVIDER_SITE_OTHER): Payer: Self-pay

## 2024-02-18 ENCOUNTER — Other Ambulatory Visit: Payer: Self-pay

## 2024-02-19 ENCOUNTER — Ambulatory Visit: Payer: Self-pay | Attending: PHYSICIAN ASSISTANT | Admitting: PHYSICIAN ASSISTANT

## 2024-02-19 ENCOUNTER — Other Ambulatory Visit: Payer: Self-pay

## 2024-02-19 ENCOUNTER — Encounter (INDEPENDENT_AMBULATORY_CARE_PROVIDER_SITE_OTHER): Payer: Self-pay | Admitting: PHYSICIAN ASSISTANT

## 2024-02-19 VITALS — BP 117/73 | HR 63 | Temp 98.6°F | Ht 66.0 in | Wt 190.8 lb

## 2024-02-19 DIAGNOSIS — M62838 Other muscle spasm: Secondary | ICD-10-CM | POA: Insufficient documentation

## 2024-02-19 DIAGNOSIS — E213 Hyperparathyroidism, unspecified: Secondary | ICD-10-CM | POA: Insufficient documentation

## 2024-02-19 DIAGNOSIS — R413 Other amnesia: Secondary | ICD-10-CM | POA: Insufficient documentation

## 2024-02-19 DIAGNOSIS — E785 Hyperlipidemia, unspecified: Secondary | ICD-10-CM | POA: Insufficient documentation

## 2024-02-19 DIAGNOSIS — E559 Vitamin D deficiency, unspecified: Secondary | ICD-10-CM | POA: Insufficient documentation

## 2024-02-19 DIAGNOSIS — I10 Essential (primary) hypertension: Secondary | ICD-10-CM | POA: Insufficient documentation

## 2024-02-19 DIAGNOSIS — F321 Major depressive disorder, single episode, moderate: Secondary | ICD-10-CM | POA: Insufficient documentation

## 2024-02-19 MED ORDER — FLUOXETINE 20 MG CAPSULE
20.0000 mg | ORAL_CAPSULE | Freq: Every day | ORAL | 0 refills | Status: AC
Start: 2024-02-19 — End: ?

## 2024-02-19 MED ORDER — MONTELUKAST 10 MG TABLET
10.0000 mg | ORAL_TABLET | Freq: Every evening | ORAL | 0 refills | Status: AC
Start: 2024-02-19 — End: ?

## 2024-02-19 MED ORDER — MELOXICAM 7.5 MG TABLET
7.5000 mg | ORAL_TABLET | Freq: Every day | ORAL | 0 refills | Status: AC
Start: 2024-02-19 — End: ?

## 2024-02-19 MED ORDER — PRAVASTATIN 20 MG TABLET: 20.0000 mg | ORAL_TABLET | Freq: Every day | ORAL | 0 refills | Status: DC

## 2024-02-19 NOTE — Nursing Note (Signed)
 02/19/24 1007   Domestic Violence   Because we are aware of abuse and domestic violence today, we ask all patients: Are you being hurt, hit, or frightened by anyone at your home or in your life?  N   Basic Needs   Do you have any basic needs within your home that are not being met? (such as Food, Shelter, [employer], Tranportation, paying for bills and/or medications) N

## 2024-02-19 NOTE — Progress Notes (Unsigned)
 FAMILY MEDICINE, MEDICAL OFFICE BUILDING  [address]  [place] [address]  Operated by [facility]    Progress Note    Bonnie Kline  1951-04-16  [id number]    Date of Service: 02/19/2024 10:00 AM EDT    Chief complaint:   Chief Complaint   Patient presents with    Follow Up 6 Weeks       Subjective:     This is a case of a 73 y.o. year old female who comes in today for follow up with daughter, Kate. Pt.had eval with pulmonologist regarding abnormal CT chest and wants recheck in 6 months. Pts.breathing ok, states she does have 1 big cough a day. Pt.states she has the results of genetic testing. Pt.wishes for us  to prescribe her Prozac  she is not going to see [facility]. Or Tonya Mcfadden anymore. She states she feels Prozac  is doing well. She continues with neck and back pain daily and sees Dr.Yee in [place].    Review of Systems   All systems reviewed and negative except for what is noted above.    Current Outpatient Medications   Medication Sig    aspirin  (ECOTRIN) 81 mg Oral Tablet, Delayed Release (E.C.) Take 1 Tablet (81 mg total) by mouth Daily    cyclobenzaprine  (FLEXERIL ) 10 mg Oral Tablet Take 1 Tablet (10 mg total) by mouth Twice per day as needed for Muscle spasms Indications: muscle spasm    diosmin complex no.1 (VASCULERA) 630 mg Oral Tablet Take 1 Tablet (630 mg total) by mouth Daily    FLUoxetine  (PROZAC ) 20 mg Oral Capsule Take 1 Capsule (20 mg total) by mouth Daily    FLUoxetine  (PROZAC ) 20 mg Oral Capsule Take 1 Capsule (20 mg total) by mouth Daily    FLUoxetine  (PROZAC ) 40 mg Oral Capsule Take 1 Capsule (40 mg total) by mouth Once a day    latanoprost (XALATAN) 0.005 % Ophthalmic Drops Instill 1 Drop into both eyes Once a day    Magnesium  Glycinate 100 mg Oral Tablet Take 4 Tablets (400 mg total) by mouth Once a day    meloxicam  (MOBIC ) 7.5 mg Oral Tablet TAKE 1 TABLET(7.5 MG) BY MOUTH TWICE DAILY AS NEEDED FOR PAIN    meloxicam  (MOBIC ) 7.5 mg Oral Tablet Take 1 Tablet  (7.5 mg total) by mouth Daily    montelukast  (SINGULAIR ) 10 mg Oral Tablet Take 1 Tablet (10 mg total) by mouth Every evening    montelukast  (SINGULAIR ) 10 mg Oral Tablet Take 1 Tablet (10 mg total) by mouth Every evening    multivitamin-minerals-lutein (MULTIVITAMIN 50 PLUS) Oral Tablet Take 1 Tablet by mouth Daily    pantoprazole  (PROTONIX ) 40 mg Oral Tablet, Delayed Release (E.C.) Take 1 Tablet (40 mg total) by mouth Daily    pravastatin  (PRAVACHOL ) 20 mg Oral Tablet Take 1 Tablet (20 mg total) by mouth Daily    SIMBRINZA 1-0.2 % Ophthalmic Drops, Suspension Instill 1 Drop into both eyes Twice daily    solifenacin  (VESICARE ) 10 mg Oral Tablet Take 1 Tablet (10 mg total) by mouth Daily       Objective:     BP 117/73   Pulse 63   Temp 37 C (98.6 F) (Temporal)   Ht 1.676 m (5' 6)   Wt 86.5 kg (190 lb 12.8 oz)   SpO2 95%   BMI 30.80 kg/m     Physical Exam  Vitals and nursing note reviewed.   Constitutional:  General: She is not in acute distress.     Appearance: Normal appearance. She is normal weight.   HENT:      Head: Normocephalic and atraumatic.      Right Ear: Tympanic membrane normal.      Left Ear: Tympanic membrane normal.      Nose: Nose normal.      Mouth/Throat:      Mouth: Mucous membranes are moist.      Pharynx: Oropharynx is clear.   Eyes:      Extraocular Movements: Extraocular movements intact.      Conjunctiva/sclera: Conjunctivae normal.   Neck:      Thyroid : No thyromegaly.      Vascular: No carotid bruit.   Cardiovascular:      Rate and Rhythm: Normal rate and regular rhythm.      Pulses: Normal pulses.      Heart sounds: Normal heart sounds. No murmur heard.  Pulmonary:      Effort: Pulmonary effort is normal.      Breath sounds: Normal breath sounds.   Musculoskeletal:         General: No swelling or deformity. Normal range of motion.      Cervical back: Normal range of motion. No tenderness.   Lymphadenopathy:      Cervical: No cervical adenopathy.   Skin:     General: Skin is  warm and dry.   Neurological:      General: No focal deficit present.      Mental Status: She is alert. Mental status is at baseline.      Cranial Nerves: No cranial nerve deficit.      Motor: No weakness.   Psychiatric:         Mood and Affect: Mood normal.         Thought Content: Thought content normal.           Assessment & Plan  Hyperlipidemia, unspecified hyperlipidemia type    Orders:    LIPID PANEL; Future    HEPATIC FUNCTION PANEL; Future    pravastatin  (PRAVACHOL ) 20 mg Oral Tablet; Take 1 Tablet (20 mg total) by mouth Daily    Hyperparathyroidism (CMS HCC)    Orders:    PARATHYROID  HORMONE (PTH); Future    THYROID  STIMULATING HORMONE (SENSITIVE TSH); Future    Neck muscle spasm  Start Mobic  7.5 mg 1 bid #60/0-will call with results       Moderate major depression (CMS HCC)  Refill Prozac   Obtain and review her Genetic Testing done with psych       HTN (hypertension)    Orders:    LIPID PANEL; Future    BASIC METABOLIC PANEL; Future    Vitamin D deficiency    Orders:    DEXA BONE DENSITOMETRY; Future    VITAMIN D 25 TOTAL; Future    Memory changes    Orders:    THYROID  STIMULATING HORMONE (SENSITIVE TSH); Future       Other orders    FLUoxetine  (PROZAC ) 20 mg Oral Capsule; Take 1 Capsule (20 mg total) by mouth Daily    montelukast  (SINGULAIR ) 10 mg Oral Tablet; Take 1 Tablet (10 mg total) by mouth Every evening    meloxicam  (MOBIC ) 7.5 mg Oral Tablet; Take 1 Tablet (7.5 mg total) by mouth Daily    montelukast  (SINGULAIR ) 10 mg Oral Tablet; Take 1 Tablet (10 mg total) by mouth Every evening          The patient was  given the opportunity to ask questions and those questions were answered to the patient's satisfaction. The patient was encouraged to call with any additional questions or concerns.    Follow up: Return in about 3 months (around 05/21/2024).    Gerardine Ammon, PA-C

## 2024-02-19 NOTE — Nursing Note (Signed)
 02/19/24 1007   Fall Risk Assessment   Do you feel unsteady when standing or walking? Yes   Do you worry about falling? Yes   Have you fallen in the past year? No

## 2024-02-19 NOTE — Nursing Note (Signed)
 02/19/24 1008   PHQ 9 (follow up)   Little interest or pleasure in doing things. 0   Feeling down, depressed, or hopeless 1   Trouble falling or staying asleep, or sleeping too much. 0   Feeling tired or having little energy 1   Poor appetite or overeating 0   Feeling bad about yourself/ that you are a failure in the past 2 weeks? 1   Trouble concentrating on things in the past 2 weeks? 1   Moving/Speaking slowly or being fidgety or restless  in the past 2 weeks? 0   Thoughts that you would be better off DEAD, or of hurting yourself in some way. 0   If you checked off any problems, how difficult have these problems made it for you to do your work, take care of things at home, or get along with other people? Not difficult at all   PHQ 9 Total 4   Interpretation of Total Score No depression

## 2024-02-19 NOTE — Nursing Note (Signed)
 The influenza vaccine was not given because the patient/caregiver declined.

## 2024-02-23 ENCOUNTER — Encounter (INDEPENDENT_AMBULATORY_CARE_PROVIDER_SITE_OTHER): Payer: Self-pay | Admitting: PHYSICIAN ASSISTANT

## 2024-02-23 ENCOUNTER — Encounter (HOSPITAL_COMMUNITY): Payer: Self-pay | Admitting: SLEEP MEDICINE

## 2024-02-23 ENCOUNTER — Ambulatory Visit: Payer: Self-pay | Attending: SLEEP MEDICINE | Admitting: SLEEP MEDICINE

## 2024-02-23 ENCOUNTER — Ambulatory Visit (HOSPITAL_COMMUNITY): Admission: RE | Admit: 2024-02-23 | Discharge: 2024-02-23 | Disposition: A | Source: Ambulatory Visit

## 2024-02-23 ENCOUNTER — Other Ambulatory Visit: Payer: Self-pay

## 2024-02-23 VITALS — BP 126/61 | HR 70 | Temp 98.1°F | Resp 18 | Ht 66.0 in | Wt 191.0 lb

## 2024-02-23 DIAGNOSIS — R0600 Dyspnea, unspecified: Secondary | ICD-10-CM

## 2024-02-23 DIAGNOSIS — K219 Gastro-esophageal reflux disease without esophagitis: Secondary | ICD-10-CM | POA: Insufficient documentation

## 2024-02-23 DIAGNOSIS — G4733 Obstructive sleep apnea (adult) (pediatric): Secondary | ICD-10-CM | POA: Insufficient documentation

## 2024-02-23 DIAGNOSIS — R053 Chronic cough: Secondary | ICD-10-CM | POA: Insufficient documentation

## 2024-02-23 DIAGNOSIS — F411 Generalized anxiety disorder: Secondary | ICD-10-CM | POA: Insufficient documentation

## 2024-02-23 MED ORDER — OMEPRAZOLE 40 MG CAPSULE,DELAYED RELEASE: 40.0000 mg | DELAYED_RELEASE_CAPSULE | Freq: Every day | ORAL | 0 refills | Status: DC

## 2024-02-23 NOTE — Addendum Note (Signed)
 Addended by: EDSEL BENTON MATSU on: 02/23/2024 08:05 AM     Modules accepted: Orders

## 2024-02-23 NOTE — Progress Notes (Signed)
 PULMONARY AND SLEEP DISORDERS, [facility]  [address]  [place] [address]  Operated by [facility]     Follow up Sleep Note    Patient Name: Bonnie Kline  Date: 02/23/2024  Department:  PULMONARY AND SLEEP DISORDERS, [facility]  MRN: [id number]  DOB: 03-10-1951  Primary Care Provider:  Gerardine Ammon, PA-C  Referring Provider:  No ref. provider found      Chief Complaint:   Chief Complaint   Patient presents with    Follow Up     Pt presents for a sleep and pulmonary visit.  PFT wasn't completed at last visit but says that she can do it today. She has wore CPAP two nights. She brought machine for a download. Therapy and compliance printed for review. DME is [employer].              History of Present Illness  Bonnie Kline is a 73 year old female with sleep apnea and gastroesophageal reflux disease who presents for follow-up regarding her CPAP use and respiratory symptoms.    She has been using a CPAP machine for her sleep apnea for at least two years. She stopped using it for about a month due to personal reasons but has resumed use in the past two nights, which has significantly improved her symptoms. She feels better in the mornings after using the CPAP.    She experiences a persistent dry, non-productive cough that occurs once a day. She also experiences frequent belching, which she associates with her gastroesophageal reflux disease. She has been on Protonix  for several years to manage her reflux symptoms. No heartburn or acid reflux symptoms accompany the cough. She has not tried other medications like Pepcid, Prilosec, or Zantac for her reflux.     Patient seen Dr. Sherre by virtual visit last time she was in the office on 01/19/2024.  She was referred due to abnormal chest CT and a chronic cough.  She has a history of sleep apnea requiring a CPAP.      Epworth assessment done in office today with a total score of 7.      Denies shortness  of breath.  Just daily dry hacking cough.      Former smoker  Quit in 1983.      Results  RADIOLOGY  Chest X-ray form 09/02/23: Mild bibasilar atelectasis, worse on left.  There are granulomatous calcifications. The lungs are otherwise clear.     Chest CT done on 11/26/2023 showing multiple (too numerous to count) 2-4 mm nodules scattered throughout the upper and lower lung fields in both lungs.  No pleural effusion no pneumothorax.        Past Medical History:  Past Medical History:   Diagnosis Date    Allergies     Anxiety     Asymptomatic varicose veins     B12 deficiency     Bilateral carotid bruits     Bulging lumbar disc     Dementia     Depression     Esophageal reflux     Hiatal hernia     History of colon cancer 2015    History of colonic polyps     Pre-Cancerous    History of radiofrequency ablation (RFA) of nerve of cervical spine     HLD (hyperlipidemia)     Hypercalcemia     Hyperparathyroidism (CMS HCC)     Hypertension     Low magnesium  level  Post-menopausal     Pulmonary nodules     Renal cyst     RLS (restless legs syndrome)     Sleep apnea     CPAP - [employer]    SVT (supraventricular tachycardia) (CMS HCC)     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) Take 1 Tablet (81 mg total) by mouth Daily    cyclobenzaprine  (FLEXERIL ) 10 mg Oral Tablet Take 1 Tablet (10 mg total) by mouth Twice per day as needed for Muscle spasms  Indications: muscle spasm    diosmin complex no.1 (VASCULERA) 630 mg Oral Tablet Take 1 Tablet (630 mg total) by mouth Daily    FLUoxetine  (PROZAC ) 20 mg Oral Capsule Take 1 Capsule (20 mg total) by mouth Daily    FLUoxetine  (PROZAC ) 20 mg Oral Capsule Take 1 Capsule (20 mg total) by mouth Daily    FLUoxetine  (PROZAC ) 40 mg Oral Capsule Take 1 Capsule (40 mg total) by mouth Once a day    latanoprost (XALATAN) 0.005 % Ophthalmic Drops Instill 1 Drop into both eyes Once a day    Magnesium  Glycinate 100 mg Oral Tablet Take 4 Tablets (400 mg total) by mouth Once a day    meloxicam  (MOBIC ) 7.5 mg Oral Tablet TAKE 1 TABLET(7.5 MG) BY MOUTH TWICE DAILY AS NEEDED FOR PAIN    meloxicam  (MOBIC ) 7.5 mg Oral Tablet Take 1 Tablet (7.5 mg total) by mouth Daily    montelukast  (SINGULAIR ) 10 mg Oral Tablet Take 1 Tablet (10 mg total) by mouth Every evening    montelukast  (SINGULAIR ) 10 mg Oral Tablet Take 1 Tablet (10 mg total) by mouth Every evening    multivitamin-minerals-lutein (MULTIVITAMIN 50 PLUS) Oral Tablet Take 1 Tablet by mouth Daily    omeprazole (PRILOSEC) 40 mg Oral Capsule, Delayed Release(E.C.) Take 1 Capsule (40 mg total) by mouth Daily    pravastatin  (PRAVACHOL ) 20 mg Oral Tablet Take 1 Tablet (20 mg total) by mouth Daily    SIMBRINZA 1-0.2 % Ophthalmic Drops, Suspension Instill 1 Drop into both eyes Twice daily    solifenacin  (VESICARE ) 10 mg Oral Tablet Take 1 Tablet (10 mg total) by mouth Daily     Allergy List  Allergy History as of 02/24/24       AMOXICILLIN         Noted Status Severity Type Reaction    07/12/21 1216 Tibbs, Katie, LPN 96/82/76 Active Medium  Rash              CLAVULANIC ACID         Noted Status Severity Type Reaction    12/02/21 1405 Clarisa Service, RN 07/12/21 Active Medium  Rash    07/12/21  1217 Nolon Pagan, LPN 96/82/76 Active                 PENICILLINS         Noted Status Severity Type Reaction    07/12/21 1217 Tibbs, Pagan, LPN 96/82/76 Active Medium  Rash              POTASSIUM  CLAVULANATE         Noted Status Severity Type Reaction    06/24/23 1041 [place], [place], [place] 02/02/23 Active Low  Itching                  Family History   Family Medical History:       Problem Relation (Age of Onset)    Alzheimer's/Dementia Father    Atherosclerosis Mother    Breast Cancer Sister    Diabetes Daughter    Emphysema Mother    Heart Disease Father, Brother, Sister    Heart Surgery Brother    Hypertension (High Blood Pressure) Father    Pacemaker Sister            Social History  Social History     Socioeconomic History    Marital status: Widowed   Tobacco Use    Smoking status: Former     Current packs/day: 0.00     Average packs/day: 1 pack/day for 16.0 years (16.0 ttl pk-yrs)     Types: Cigarettes     Start date: 66     Quit date: 5     Years since quitting: 42.8     Passive exposure: Past    Smokeless tobacco: Never   Vaping Use    Vaping status: Never Used   Substance and Sexual Activity    Alcohol use: Never    Drug use: Never        Review of system  General:  Denies fever, chills, night sweats, loss of appetite.  Neurological:  Denies dizziness or seizures.  Ears, Nose, Throat: Denies ear pain, nasal/sinus congestion or pain, or sore throat.   Gastrointestinal:  Denies uncontrolled reflux, heartburn, nausea, or diarrhea.  Cardiovascular:  Denies chest pain or irregular heartbeats.  Respiratory: see HPI  Musculoskeletal:  Denies uncontrolled joint pain or restless legs.  Endocrine/Metabolic:  Denies weight gain or weight loss.  Mental Status/Psychiatric:  Denies uncontrolled anxiety or depression.  Integumentary:  Denies rash or new abnormal skin lesions.    Vital Signs  Vitals:    02/23/24 1339   BP: 126/61   Pulse: 70   Resp: 18   Temp: 36.7 C (98.1 F)   TempSrc: Oral   SpO2: 92%   Weight: 86.6 kg (191 lb)   Height: 1.676 m (5' 6)   BMI: 30.83          Physical Exam  VITALS: BP- 105/52  GENERAL APPEARANCE OF THE PT: Alert, no acute distress. Normal appearance, well nourished.  EYES: Normal  eye lids. Conjunctiva normal.  EARS NOSE MOUTH AND THROAT: External inspection of ears and nose with normal appearance. Nares patent.  NECK: Supple with trachea midline, non tender, no nodules, no masses, gland position midline.  RESPIRATORY: Crackles in right lower lobe. Respiratory effort with no retractions, breathing regular and unlabored.  CARDIOVASCULAR: Heart normal on auscultation. Regular rhythm and regular rate. No murmur, no peripheral edema.  MUSCULOSKELETAL: no digital cyanosis or clubbing.  MENTAL STATUSPSYCHIATRIC: Alert- oriented to person, place, and time. Appropriate and normal mood.         ICD-10-CM  1. Chronic cough  R05.3       2. Gastroesophageal reflux disease, unspecified whether esophagitis present  K21.9       3. Dyspnea, unspecified type  R06.00 PULMONARY FUNCTION TESTING - ADULT      4. Obstructive sleep apnea of adult  G47.33       5. Generalized anxiety disorder  F41.1          Assessment & Plan  Obstructive sleep apnea  Obstructive sleep apnea managed with CPAP. She had not been using CPAP consistently but has resumed use in the last two nights, resulting in significant improvement in symptoms. CPAP usage has reduced apnea events from four to one or two per night.  - Encourage consistent nightly use of CPAP.  - Monitor apnea events through CPAP data.    Mild bibasilar atelectasis  Mild bibasilar atelectasis noted on previous imaging. Current examination reveals slight crackles in the right lower lobe, possibly due to atelectasis.  - Proceed with scheduled chest CT to evaluate lung condition. Will request copy of CT results and can discuss results with patient.    - Provide incentive spirometer for deep breathing exercises.    Gastroesophageal reflux disease  Gastroesophageal reflux disease with symptoms of cough and belching. Currently on Protonix  for several years. Discussed potential switch to Prilosec (omeprazole) due to persistent symptoms. Discussed risks of proton pump  inhibitors, including potential for bone loss due to decreased calcium absorption. 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  - Discontinue Protonix  and start Prilosec (omeprazole) prescription.  - Consider adding Pepcid if symptoms persist after switching to Prilosec.  - Discuss bone health and potential need for further evaluation with primary care provider.  - Send Prilosec prescription to [facility] pharmacy.    Orders Placed This Encounter    PULMONARY FUNCTION TESTING - ADULT    omeprazole (PRILOSEC) 40 mg Oral Capsule, Delayed Release(E.C.)       Continue medications as prescribed/directed unless changed by provider.  Plan of care discussed with patient.    Return for 4-6 weeks.. Patient was advised to come back earlier if any symptoms get worse.  Also advised to come back for results of any test done.  If unable to keep regular appointment, patient was advised to schedule another appointment as soon as possible.     The patient was given the opportunity to ask questions and those questions were answered to the patient's satisfaction. The patient was encouraged to call with any additional questions or concerns. Discussed with the patient effects and side effects of medications. Medication safety was discussed.  The patient was informed to contact the office within 7 business days if a message/lab results/referral/imaging results have not been conveyed to the patient.    Electronically signed by Ronal Kerns, APRN, CNP   Pulmonary and Critical care    This note was created with assistance from [employer] via capture of conversational audio. Consent was obtained from the patient and all parties present prior to recording.      This note may have been partially generated using M[employer] Fluency Direct system, and there may be some incorrect words, spellings, and punctuation that were not noted in checking the note before saving.

## 2024-02-24 ENCOUNTER — Ambulatory Visit (HOSPITAL_COMMUNITY): Payer: Self-pay | Admitting: SLEEP MEDICINE

## 2024-02-24 DIAGNOSIS — K219 Gastro-esophageal reflux disease without esophagitis: Secondary | ICD-10-CM | POA: Insufficient documentation

## 2024-02-24 DIAGNOSIS — R0600 Dyspnea, unspecified: Secondary | ICD-10-CM

## 2024-02-25 ENCOUNTER — Telehealth (INDEPENDENT_AMBULATORY_CARE_PROVIDER_SITE_OTHER): Payer: Self-pay | Admitting: PHYSICIAN ASSISTANT

## 2024-02-25 ENCOUNTER — Other Ambulatory Visit (INDEPENDENT_AMBULATORY_CARE_PROVIDER_SITE_OTHER): Payer: Self-pay

## 2024-02-25 NOTE — Telephone Encounter (Signed)
-----   Message from Gerardine Ammon, PA-C sent at 02/24/2024  6:12 PM EDT -----  Regarding: labs  Chol and LDL elevated, but good chol elevated also, continue Pravachol  as directed and recheck 3 months  Vitamin D is low normal start OTC vitamin D 1000 1 daily OTC  Other labs look good    Hard copy scanned into EHR

## 2024-02-25 NOTE — Telephone Encounter (Signed)
 Pt is aware and voiced she understood.

## 2024-03-03 ENCOUNTER — Encounter (INDEPENDENT_AMBULATORY_CARE_PROVIDER_SITE_OTHER): Payer: Self-pay | Admitting: PHYSICIAN ASSISTANT

## 2024-03-04 ENCOUNTER — Other Ambulatory Visit (INDEPENDENT_AMBULATORY_CARE_PROVIDER_SITE_OTHER): Payer: Self-pay | Admitting: PULMONARY DISEASE

## 2024-03-04 ENCOUNTER — Ambulatory Visit
Admission: RE | Admit: 2024-03-04 | Discharge: 2024-03-04 | Disposition: A | Discharge status: Home / Self Care | Payer: Self-pay | Source: Ambulatory Visit | Attending: PULMONARY DISEASE | Admitting: PULMONARY DISEASE

## 2024-03-04 ENCOUNTER — Other Ambulatory Visit: Payer: Self-pay

## 2024-03-04 DIAGNOSIS — R918 Other nonspecific abnormal finding of lung field: Secondary | ICD-10-CM | POA: Insufficient documentation

## 2024-03-04 DIAGNOSIS — Z6830 Body mass index (BMI) 30.0-30.9, adult: Secondary | ICD-10-CM | POA: Insufficient documentation

## 2024-03-04 DIAGNOSIS — R053 Chronic cough: Secondary | ICD-10-CM | POA: Insufficient documentation

## 2024-03-04 DIAGNOSIS — Z87891 Personal history of nicotine dependence: Secondary | ICD-10-CM | POA: Insufficient documentation

## 2024-03-04 DIAGNOSIS — Z122 Encounter for screening for malignant neoplasm of respiratory organs: Secondary | ICD-10-CM

## 2024-03-04 DIAGNOSIS — E6609 Other obesity due to excess calories: Secondary | ICD-10-CM | POA: Insufficient documentation

## 2024-03-04 DIAGNOSIS — E66811 Obesity, class 1: Secondary | ICD-10-CM | POA: Insufficient documentation

## 2024-03-07 ENCOUNTER — Telehealth (INDEPENDENT_AMBULATORY_CARE_PROVIDER_SITE_OTHER): Payer: Self-pay | Admitting: PHYSICIAN ASSISTANT

## 2024-03-07 ENCOUNTER — Encounter (INDEPENDENT_AMBULATORY_CARE_PROVIDER_SITE_OTHER): Payer: Self-pay

## 2024-03-07 ENCOUNTER — Other Ambulatory Visit (INDEPENDENT_AMBULATORY_CARE_PROVIDER_SITE_OTHER): Payer: Self-pay | Admitting: PHYSICIAN ASSISTANT

## 2024-03-07 MED ORDER — ALENDRONATE 35 MG TABLET
35.0000 mg | ORAL_TABLET | ORAL | 1 refills | Status: AC
Start: 2024-03-07 — End: ?

## 2024-03-07 NOTE — Telephone Encounter (Signed)
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

## 2024-03-07 NOTE — Telephone Encounter (Signed)
 Left vmail to call back or to check the my chart messages for message. Med was sent into the pharmacy.

## 2024-03-07 NOTE — Telephone Encounter (Signed)
 Pt says she's not too fond of taking more meds. Pt states she's not having an issues. Pt states she will talk to her daughter about it.

## 2024-03-28 ENCOUNTER — Ambulatory Visit (HOSPITAL_COMMUNITY): Payer: Self-pay

## 2024-04-14 ENCOUNTER — Encounter (HOSPITAL_COMMUNITY): Payer: Self-pay | Admitting: SLEEP MEDICINE

## 2024-04-14 ENCOUNTER — Other Ambulatory Visit: Payer: Self-pay

## 2024-04-14 ENCOUNTER — Ambulatory Visit: Payer: Self-pay | Attending: SLEEP MEDICINE | Admitting: SLEEP MEDICINE

## 2024-04-14 VITALS — BP 124/58 | HR 69 | Temp 97.5°F | Resp 20 | Ht 66.0 in | Wt 193.2 lb

## 2024-04-14 DIAGNOSIS — Z87891 Personal history of nicotine dependence: Secondary | ICD-10-CM | POA: Insufficient documentation

## 2024-04-14 DIAGNOSIS — F419 Anxiety disorder, unspecified: Secondary | ICD-10-CM | POA: Insufficient documentation

## 2024-04-14 DIAGNOSIS — K219 Gastro-esophageal reflux disease without esophagitis: Secondary | ICD-10-CM | POA: Insufficient documentation

## 2024-04-14 DIAGNOSIS — J309 Allergic rhinitis, unspecified: Secondary | ICD-10-CM | POA: Insufficient documentation

## 2024-04-14 DIAGNOSIS — R053 Chronic cough: Secondary | ICD-10-CM | POA: Insufficient documentation

## 2024-04-14 DIAGNOSIS — G4733 Obstructive sleep apnea (adult) (pediatric): Secondary | ICD-10-CM | POA: Insufficient documentation

## 2024-04-14 NOTE — Progress Notes (Signed)
 PULMONARY AND SLEEP DISORDERS, [facility]  [address]  [place] [address]  Operated by [facility]     Follow up/Progress Note    Patient Name: Bonnie Kline  Date: 04/14/2024  Department:  PULMONARY AND SLEEP DISORDERS, [facility]  MRN: [id number]  DOB: 06-18-1950  Primary Care Provider:  Gerardine Ammon, PA-C  Referring Provider:  No ref. provider found      Chief Complaint:   Chief Complaint   Patient presents with    Follow Up     Pt presents for a sleep and pulmonary follow up. PFT and chest CT completed since last visit. Continued use of CPAP. Therapy and compliance requested. DME is [employer].          History of Present Illness  Bonnie Kline is a 73 year old female with sleep apnea who presents for follow-up on CPAP usage.  Daughter in office with patient and reports she can tell a difference with patients energy when she wears CPAP.      She uses the CPAP machine for an average of three hours and fifty-six minutes per night, slightly below the recommended four hours. She has used the machine for 33 out of the last 52 days, achieving over four hours of use on 26 days. Her average apnea index while using the CPAP is 2.6, an improvement from her previous severe category with an apnea index of 41 during REM sleep and 18 overall.    She experiences pressure inside her head when using the CPAP, which subsides after a while. This sensation subsides after a while. She also has a cough primarily in the morning, described as similar to a 'smoker's cough', although she quit smoking over 40 years ago. This cough does not persist throughout the day.    She uses distilled water  in her CPAP machine to prevent dryness. She has previously been on Singulair  and Protonix  for potential sinus issues and GERD, respectively, but these did not alleviate her symptoms.  Was started on Omeprazole  at last visit for GERD symptoms.  Did not help.  PCP restarted  Protonix .      She engages in breathing exercises and has improved her performance, reaching a level of 3000 on her breathing test, which she finds enjoyable and beneficial for her lung function.     Epworth assessment done in office today with a total score of 4    CPAP Compliance and therapy requested and reviewed from dates between 02/21/24 through 04/12/24  Pressure set at 9 cm H2O.  Use > 4 hours-- 50 %  Used  63 % of nights  AHI while treated-- 2.6     Denies shortness of breath.  Just daily dry hacking cough.       Former smoker  Quit in 1983.      Results  Radiology  Chest X-ray form 09/02/23: Mild bibasilar atelectasis, worse on left.  There are granulomatous calcifications. The lungs are otherwise clear.      Chest CT done on 11/26/2023 showing multiple (too numerous to count) 2-4 mm nodules scattered throughout the upper and lower lung fields in both lungs.  No pleural effusion no pneumothorax.    LD Chest CT done 03/04/24 showing The lungs are normally expanded and clear.  No pleural effusion.  No pneumothorax.  No concerning pulmonary nodules. No mediastinal, hilar, or axillary lymphadenopathy.     PFT done 02/23/24 showing normal. No obstruction.  No restriction.  Normal  DLCO.       Asked about methacholine challenge but patient does not want to do it.  Patient has daily cough and offered trial of steroid inhaler and declined.     Past Medical History:  Past Medical History:   Diagnosis Date    Allergies     Anxiety     Asymptomatic varicose veins     B12 deficiency     Bilateral carotid bruits     Bulging lumbar disc     Dementia     Depression     Esophageal reflux     Hiatal hernia     History of colon cancer 2015    History of colonic polyps     Pre-Cancerous    History of radiofrequency ablation (RFA) of nerve of cervical spine     HLD (hyperlipidemia)     Hypercalcemia     Hyperparathyroidism (CMS HCC)     Hypertension     Low magnesium  level     Post-menopausal     Pulmonary nodules     Renal cyst      RLS (restless legs syndrome)     Sleep apnea     CPAP - [employer]    SVT (supraventricular tachycardia) (CMS HCC)     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)     Unspecified urinary incontinence     Vitamin D deficiency      Past Surgical History  Past Surgical History:   Procedure Laterality Date    COLONOSCOPY      ESOPHAGOGASTRODUODENOSCOPY      HX CARDIAC ABLATION  2013    HX CARPAL TUNNEL RELEASE Left 2010    HX COLON SURGERY (ANY)      Colon resection- due to colon cancer    HX TAH AND BSO  1998    ORTHOPEDIC SURGERY      right ankle    PARATHYROID  GLAND SURGERY      RADIOFREQUENCY ABLATION      Cervical spine     Medication List  Current Outpatient Medications   Medication Sig    Alendronate  (FOSAMAX ) 35 mg Oral Tablet Take 1 Tablet (35 mg total) by mouth Every 7 days    aspirin  (ECOTRIN) 81 mg Oral Tablet, Delayed Release (E.C.) Take 1 Tablet (81 mg total) by mouth Daily    buPROPion  (WELLBUTRIN ) 100 mg Oral Tablet Take 1 Tablet (100 mg total) by mouth Twice daily    busPIRone  (BUSPAR ) 10 mg Oral Tablet     cyclobenzaprine  (FLEXERIL ) 10 mg Oral Tablet Take 1 Tablet (10 mg total) by mouth Twice per day as needed for Muscle spasms Indications: muscle spasm    diosmin complex no.1 (VASCULERA) 630 mg Oral Tablet Take 1 Tablet (630 mg total) by mouth Daily    famotidine  (PEPCID ) 20 mg Oral Tablet Take 1 Tablet (20 mg total) by mouth Twice daily for 30 days    FLUoxetine  (PROZAC ) 20 mg Oral Capsule Take 1 Capsule (20 mg total) by mouth Daily    FLUoxetine  (PROZAC ) 20 mg Oral Capsule Take 1 Capsule (20 mg total) by mouth Daily    FLUoxetine  (PROZAC ) 40 mg Oral Capsule Take 1 Capsule (40 mg total) by mouth Once a day    latanoprost (XALATAN) 0.005 % Ophthalmic Drops Instill 1 Drop into both eyes Once a day    Magnesium  Glycinate 100 mg Oral Tablet Take 4 Tablets (400 mg total) by mouth Once a day    meloxicam  (MOBIC ) 7.5 mg Oral Tablet TAKE 1  TABLET(7.5 MG) BY MOUTH TWICE DAILY AS NEEDED FOR PAIN    meloxicam  (MOBIC ) 7.5 mg Oral  Tablet Take 1 Tablet (7.5 mg total) by mouth Daily    montelukast  (SINGULAIR ) 10 mg Oral Tablet Take 1 Tablet (10 mg total) by mouth Every evening (Patient not taking: Reported on 04/14/2024)    montelukast  (SINGULAIR ) 10 mg Oral Tablet Take 1 Tablet (10 mg total) by mouth Every evening (Patient not taking: Reported on 04/14/2024)    multivitamin-minerals-lutein (MULTIVITAMIN 50 PLUS) Oral Tablet Take 1 Tablet by mouth Daily    pantoprazole  (PROTONIX ) 40 mg Oral Tablet, Delayed Release (E.C.) Take 1 Tablet (40 mg total) by mouth Daily for 30 days    pravastatin  (PRAVACHOL ) 20 mg Oral Tablet Take 1 Tablet (20 mg total) by mouth Daily    SIMBRINZA 1-0.2 % Ophthalmic Drops, Suspension Instill 1 Drop into both eyes Twice daily    solifenacin  (VESICARE ) 10 mg Oral Tablet Take 1 Tablet (10 mg total) by mouth Daily     Allergy List  Allergy History as of 04/26/24       AMOXICILLIN         Noted Status Severity Type Reaction    07/12/21 1216 Tibbs, Katie, LPN 96/82/76 Active Medium  Rash              CLAVULANIC ACID         Noted Status Severity Type Reaction    12/02/21 1405 Clarisa Service, RN 07/12/21 Active Medium  Rash    07/12/21 1217 Tibbs, Katie, LPN 96/82/76 Active                 PENICILLINS         Noted Status Severity Type Reaction    07/12/21 1217 Tibbs, Katie, LPN 96/82/76 Active Medium  Rash              POTASSIUM CLAVULANATE         Noted Status Severity Type Reaction    06/24/23 1041 B[place], [place], [place] 02/02/23 Active Low  Itching                  Family History   Family Medical History:       Problem Relation (Age of Onset)    Alzheimer's/Dementia Father    Atherosclerosis Mother    Breast Cancer Sister    Diabetes Daughter    Emphysema Mother    Heart Disease Father, Brother, Sister    Heart Surgery Brother    Hypertension (High Blood Pressure) Father    Pacemaker Sister            Social History  Social History     Socioeconomic History    Marital status: Widowed   Tobacco Use    Smoking status: Former      Current packs/day: 0.00     Average packs/day: 1 pack/day for 16.0 years (16.0 ttl pk-yrs)     Types: Cigarettes     Start date: 18     Quit date: 31     Years since quitting: 43.0     Passive exposure: Past    Smokeless tobacco: Never   Vaping Use    Vaping status: Never Used   Substance and Sexual Activity    Alcohol use: Never    Drug use: Never        Review of system  General:  Denies fever, chills, night sweats, loss of appetite.  Neurological:  Denies dizziness or seizures.  Ears, Nose,  Throat: Denies ear pain, nasal/sinus congestion or pain, or sore throat.   Gastrointestinal:  Denies uncontrolled reflux, heartburn, nausea, or diarrhea.  Cardiovascular:  Denies chest pain or irregular heartbeats.  Respiratory: see HPI  Musculoskeletal:  Denies uncontrolled joint pain or restless legs.  Endocrine/Metabolic:  Denies weight gain or weight loss.  Mental Status/Psychiatric:  Denies uncontrolled anxiety or depression.  Integumentary:  Denies rash or new abnormal skin lesions.    Objective:  Vital Signs  Vitals:    04/14/24 1052   BP: (!) 124/58   Pulse: 69   Resp: 20   Temp: 36.4 C (97.5 F)   TempSrc: Temporal   SpO2: 95%   Weight: 87.6 kg (193 lb 3.2 oz)   Height: 1.676 m (5' 6)   BMI: 31.18          Physical Exam  GENERAL APPEARANCE OF THE PT: Alert, no acute distress. Normal appearance, well nourished.  EYES: Normal eye lids. Conjunctiva normal.  EARS NOSE MOUTH AND THROAT: External inspection of ears and nose with normal appearance. Nares patent.  NECK: Supple with trachea midline, non tender, no nodules, no masses, gland position midline.  RESPIRATORY: Lungs clear to auscultation with normal breath sounds, no rales, no rhonchi, no wheezing. Respiratory effort with no retractions, breathing regular and unlabored.  CARDIOVASCULAR: Regular rhythm and regular rate. No murmur, no peripheral edema.  MUSCULOSKELETAL: normal digits, no digital cyanosis or clubbing.  MENTAL STATUSPSYCHIATRIC: Alert- oriented to  person, place, and time. Appropriate and normal mood.         ICD-10-CM    1. Chronic cough  R05.3       2. Obstructive sleep apnea of adult  G47.33       3. Gastroesophageal reflux disease, unspecified whether esophagitis present  K21.9       4. Allergic rhinitis  J30.9       5. Former smoker  Z87.891       6. Anxiety  F41.9          Assessment & Plan  Obstructive sleep apnea  Managed with CPAP therapy. Current usage is slightly below the recommended threshold, averaging 3 hours and 56 minutes per night, with 50% of nights reaching the 4-hour mark. Apnea-hypopnea index (AHI) has improved significantly from severe levels (41 apneas per hour in REM sleep, 18 overall) to an average of 2.6 apneas per hour. Occasional pressure sensation in the sinuses and oral cavity is likely due to CPAP pressure requirements. No significant changes in pressure settings are needed as the average AHI is satisfactory.  - Encouraged increasing CPAP usage to at least 4 hours per night, 70% of the time.  - Educated on the importance of consistent CPAP use and its benefits.  - Reassured regarding the pressure sensation and explained its cause.  - Patient continues to require and benefit from use of CPAP.    Chronic cough  Intermittent morning cough, possibly related to CPAP use or residual effects from past smoking. Previous trials of Singulair  and Omeprazole  were ineffective. CT scan results were normal, and pulmonary function tests showed good lung function. The cough is likely benign and related to normal respiratory clearance.  - Continue to monitor cough for any changes in frequency or severity.  - Reassured regarding the benign nature of the cough and its likely relation to normal respiratory clearance.     Continue medications as prescribed/directed unless changed by provider.  Plan of care discussed with patient.    Return in  about 6 months (around 10/13/2024).. Patient was advised to come back earlier if any symptoms get worse.  Also  advised to come back for results of any test done.  If unable to keep regular appointment, patient was advised to schedule another appointment as soon as possible.     The patient was given the opportunity to ask questions and those questions were answered to the patient's satisfaction. The patient was encouraged to call with any additional questions or concerns. Discussed with the patient effects and side effects of medications. Medication safety was discussed.  The patient was informed to contact the office within 7 business days if a message/lab results/referral/imaging results have not been conveyed to the patient.    Electronically signed by Ronal Kerns, APRN, CNP     This note was created with assistance from Abridge via capture of conversational audio. Consent was obtained from the patient and all parties present prior to recording.        This note may have been partially generated using MModal Fluency Direct system, and there may be some incorrect words, spellings, and punctuation that were not noted in checking the note before saving.

## 2024-04-19 ENCOUNTER — Emergency Department: Admission: EM | Admit: 2024-04-19 | Discharge: 2024-04-19 | Disposition: A | Discharge status: Home / Self Care

## 2024-04-19 ENCOUNTER — Other Ambulatory Visit: Payer: Self-pay

## 2024-04-19 ENCOUNTER — Emergency Department (HOSPITAL_COMMUNITY)

## 2024-04-19 DIAGNOSIS — K2970 Gastritis, unspecified, without bleeding: Secondary | ICD-10-CM | POA: Insufficient documentation

## 2024-04-19 DIAGNOSIS — K2980 Duodenitis without bleeding: Secondary | ICD-10-CM | POA: Insufficient documentation

## 2024-04-19 DIAGNOSIS — K2900 Acute gastritis without bleeding: Secondary | ICD-10-CM

## 2024-04-19 DIAGNOSIS — R9431 Abnormal electrocardiogram [ECG] [EKG]: Secondary | ICD-10-CM | POA: Insufficient documentation

## 2024-04-19 DIAGNOSIS — R7989 Other specified abnormal findings of blood chemistry: Secondary | ICD-10-CM | POA: Insufficient documentation

## 2024-04-19 DIAGNOSIS — R1013 Epigastric pain: Secondary | ICD-10-CM | POA: Insufficient documentation

## 2024-04-19 LAB — ECG 12 LEAD
Atrial Rate: 61 {beats}/min
Calculated P Axis: 56 degrees
Calculated R Axis: 54 degrees
Calculated T Axis: -45 degrees
PR Interval: 132 ms
QRS Duration: 106 ms
QT Interval: 398 ms
QTC Calculation: 403 ms
Ventricular rate: 62 {beats}/min

## 2024-04-19 LAB — B-TYPE NATRIURETIC PEPTIDE (BNP),PLASMA: BNP: 102 pg/mL — ABNORMAL HIGH (ref 1–100)

## 2024-04-19 LAB — COMPREHENSIVE METABOLIC PANEL, NON-FASTING
ALBUMIN/GLOBULIN RATIO: 1.3 (ref 0.8–1.4)
ALBUMIN: 4 g/dL (ref 3.5–5.7)
ALKALINE PHOSPHATASE: 109 U/L — ABNORMAL HIGH (ref 34–104)
ALT (SGPT): 11 U/L (ref 7–52)
ANION GAP: 8 mmol/L (ref 4–13)
AST (SGOT): 13 U/L (ref 13–39)
BILIRUBIN TOTAL: 0.6 mg/dL (ref 0.3–1.0)
BUN/CREA RATIO: 21 (ref 6–22)
BUN: 16 mg/dL (ref 7–25)
CALCIUM, CORRECTED: 9.1 mg/dL (ref 8.9–10.8)
CALCIUM: 9.1 mg/dL (ref 8.6–10.3)
CHLORIDE: 108 mmol/L — ABNORMAL HIGH (ref 98–107)
CO2 TOTAL: 24 mmol/L (ref 21–31)
CREATININE: 0.77 mg/dL (ref 0.60–1.30)
ESTIMATED GFR: 81 mL/min/1.73mˆ2 (ref >59)
GLOBULIN: 3 (ref 2.0–3.5)
GLUCOSE: 95 mg/dL (ref 74–109)
OSMOLALITY, CALCULATED: 280 mosm/kg (ref 270–290)
POTASSIUM: 4.2 mmol/L (ref 3.5–5.1)
PROTEIN TOTAL: 7 g/dL (ref 6.4–8.9)
SODIUM: 140 mmol/L (ref 136–145)

## 2024-04-19 LAB — CBC WITH DIFF
BASOPHIL #: 0.1 x10ˆ3/uL (ref 0.00–0.10)
BASOPHIL %: 1 % (ref 0–1)
EOSINOPHIL #: 0.4 x10ˆ3/uL (ref 0.00–0.50)
EOSINOPHIL %: 4 % (ref 1–7)
HCT: 40.8 % (ref 31.2–41.9)
HGB: 13.8 g/dL (ref 10.9–14.3)
LYMPHOCYTE #: 1.9 x10ˆ3/uL (ref 1.10–3.10)
LYMPHOCYTE %: 24 % (ref 16–46)
MCH: 28.7 pg (ref 24.7–32.8)
MCHC: 33.9 g/dL (ref 32.3–35.6)
MCV: 84.8 fL (ref 75.5–95.3)
MONOCYTE #: 0.5 x10ˆ3/uL (ref 0.20–0.90)
MONOCYTE %: 7 % (ref 4–11)
MPV: 9.2 fL (ref 7.9–10.8)
NEUTROPHIL #: 5.1 x10ˆ3/uL (ref 1.90–8.20)
NEUTROPHIL %: 64 % (ref 43–77)
PLATELETS: 246 x10ˆ3/uL (ref 140–440)
RBC: 4.82 x10ˆ6/uL (ref 3.63–4.92)
RDW: 14.5 % (ref 12.3–17.7)
WBC: 7.9 x10ˆ3/uL (ref 3.8–11.8)

## 2024-04-19 LAB — LIPASE: LIPASE: 17 U/L (ref 11–82)

## 2024-04-19 LAB — PT/INR
INR: 1.1 (ref 0.84–1.10)
PROTHROMBIN TIME: 12.4 s (ref 9.8–12.7)

## 2024-04-19 LAB — PTT (PARTIAL THROMBOPLASTIN TIME): APTT: 31.4 s (ref 25.0–38.0)

## 2024-04-19 LAB — TROPONIN-I
TROPONIN I: 3 ng/L (ref <15)
TROPONIN I: 3 ng/L (ref <15)

## 2024-04-19 MED ORDER — SODIUM CHLORIDE 0.9 % (FLUSH) INJECTION SYRINGE: 3.0000 mL | INJECTION | INTRAMUSCULAR | Status: DC | PRN

## 2024-04-19 MED ORDER — FAMOTIDINE 20 MG TABLET
ORAL_TABLET | ORAL | Status: AC
  Filled 2024-04-19: qty 1

## 2024-04-19 MED ORDER — FAMOTIDINE (PF) 20 MG/2 ML INTRAVENOUS SOLUTION
20.0000 mg | INTRAVENOUS | Status: AC
  Administered 2024-04-19: 20 mg via INTRAVENOUS

## 2024-04-19 MED ORDER — HYOSCYAMINE 0.125 MG/5 ML ORAL ELIXIR
10.0000 mL | ORAL_SOLUTION | Freq: Once | ORAL | Status: AC
  Administered 2024-04-19: 10 mL via ORAL

## 2024-04-19 MED ORDER — FAMOTIDINE (PF) 20 MG/2 ML INTRAVENOUS SOLUTION
INTRAVENOUS | Status: AC
  Filled 2024-04-19: qty 2

## 2024-04-19 MED ORDER — ALUMINUM-MAG HYDROXIDE-SIMETHICONE 200 MG-200 MG-20 MG/5 ML ORAL SUSP
ORAL | Status: AC
  Filled 2024-04-19: qty 30

## 2024-04-19 MED ORDER — SODIUM CHLORIDE 0.9 % IV BOLUS
1000.0000 mL | INJECTION | Status: AC
  Administered 2024-04-19: 0 mL via INTRAVENOUS
  Administered 2024-04-19: 1000 mL via INTRAVENOUS

## 2024-04-19 MED ORDER — FAMOTIDINE 20 MG TABLET: 20.0000 mg | ORAL_TABLET | Freq: Two times a day (BID) | ORAL | 0 refills | Status: AC

## 2024-04-19 MED ORDER — LIDOCAINE HCL 2 % MUCOSAL SOLUTION
15.0000 mL | Freq: Once | Status: AC
  Administered 2024-04-19: 15 mL via ORAL

## 2024-04-19 MED ORDER — HYOSCYAMINE 0.125 MG/5 ML ORAL ELIXIR
ORAL_SOLUTION | ORAL | Status: AC
  Filled 2024-04-19: qty 10

## 2024-04-19 MED ORDER — SODIUM CHLORIDE 0.9 % (FLUSH) INJECTION SYRINGE
3.0000 mL | INJECTION | Freq: Three times a day (TID) | INTRAMUSCULAR | Status: DC
  Administered 2024-04-19: 0 mL

## 2024-04-19 MED ORDER — IOHEXOL 350 MG IODINE/ML INTRAVENOUS SOLUTION
75.0000 mL | INTRAVENOUS | Status: AC
  Administered 2024-04-19: 75 mL via INTRAVENOUS

## 2024-04-19 MED ORDER — PANTOPRAZOLE 40 MG TABLET,DELAYED RELEASE: 40.0000 mg | DELAYED_RELEASE_TABLET | Freq: Every day | ORAL | 0 refills | Status: AC

## 2024-04-19 MED ORDER — DEXTROSE 5% IN WATER (D5W) FLUSH BAG - 250 ML: INTRAVENOUS | Status: DC | PRN

## 2024-04-19 MED ORDER — LIDOCAINE HCL 2 % MUCOSAL SOLUTION
Status: AC
  Filled 2024-04-19: qty 15

## 2024-04-19 MED ORDER — ALUMINUM-MAG HYDROXIDE-SIMETHICONE 200 MG-200 MG-20 MG/5 ML ORAL SUSP
30.0000 mL | Freq: Once | ORAL | Status: AC
  Administered 2024-04-19: 30 mL via ORAL

## 2024-04-19 MED ORDER — SODIUM CHLORIDE 0.9% FLUSH BAG - 250 ML: INTRAVENOUS | Status: DC | PRN

## 2024-04-19 NOTE — Discharge Instructions (Addendum)
 Take Protonix /Pepcid  daily.  Please follow up with PCP to schedule endoscopy.  Return to the ER for any new or worsening symptoms

## 2024-04-19 NOTE — ED Triage Notes (Signed)
 Epigastric pain, denies N/V/D, last BM yesterday

## 2024-04-19 NOTE — ED APP Handoff Note (Signed)
 Emergency Medicine      Name: Bonnie Kline  Age and Gender: 73 y.o. female  Date of Birth: 30-Nov-1950  MRN: [id number]    ED Course Since Sign Out:  Care assumed from nightshift APP pending CT scan. Ct scan showed gastritis and severe duodenitis.  Patient had complete relief of discomfort with Pepcid  and GI cocktail.  Patient will be discharged with the omeprazole  and Pepcid  prescription.  Encouraged close follow up with GI/Dr. Moments for endoscopy.  Patient agreeable to treatment course.  Daughter also agreeable to treatment course.  All questions answered.  Strict ER return precautions given.  Patient is stable and suitable for discharge      Filed Vitals:    04/19/24 0515 04/19/24 0545 04/19/24 0600 04/19/24 0613   BP: 137/81 136/63 (!) 108/54    Pulse: 56 56 57    Resp: (!) 25 (!) 24 19 18    Temp:       SpO2: 90% 91% 97%      ED Course as of 04/19/24 0628   Tue Apr 19, 2024   0621 CT ABDOMEN PELVIS W IV CONTRAST  Gastritis and severe duodenitis. Possible ulcerative disease disease. Recommend endoscopic assessment.           Impression:   Clinical Impression   Epigastric abdominal pain (Primary)   Gastritis, presence of bleeding unspecified, unspecified chronicity, unspecified gastritis type         Disposition: Discharged      Discharge Instructions Given to Patient:      / M. Sueanne Benders, APRN, FNP-C  04/19/2024, 06:25  [facility]  Department of Emergency Medicine  [place]  [place]      Portions of this note may have been dictated using voice recognition software.

## 2024-04-19 NOTE — ED Provider Notes (Signed)
 [facility]  ED Primary Provider Note        Arrival: The patient arrived by Private Vehicle     Consent for AI Scribe software Abridge discussed/obtained verbally for encounter with patient    History of Present Illness   chief complaint  Bonnie Kline is a 73 y.o. female who had concerns including Epigastric Pain.     History of Present Illness  Bonnie Kline is a 73 year old female who presents with epigastric pain.    She experiences dull, constant epigastric pain located in the upper abdominal area, which worsened today. There are no sharp or burning sensations. Describes the pain as dull. She reports that the pain stays in the upper abdominal area, does not radiate.     Her past medical history includes supraventricular tachycardia (SVT) for which she underwent an ablation years ago. She denies any history of ulcers, pancreatitis, gallbladder problems, or heart attacks. She has not had a recent stress test, with the last one being years ago.    She was previously on Protonix  and Prilosec, possibly for reflux, but stopped these medications on April 13, 2024. She and her family are unsure of the original reason for starting these medications. Review of EMR shows an EGD performed on 04/2022 by Dr Steen which shows gastritis     She does not smoke, vape, or consume alcohol. There is no mention of any recent endoscopic procedures, although there is uncertainty about whether she had an esophagogastroduodenoscopy (EGD) with her last colonoscopy.    She occasionally feels like she wants to vomit but has not vomited. She denies diarrhea, constipation, fever, chills, body aches, urinary symptoms, chest pain, or shortness of breath.    Review of Systems     No other overt Review of Systems are noted to be positive except noted in the HPI.    Historical Data   History Reviewed This Encounter:     Past Medical/Surgical/Social History  Past Medical History:   Diagnosis Date     Allergies     Anxiety     Asymptomatic varicose veins     B12 deficiency     Bilateral carotid bruits     Bulging lumbar disc     Dementia     Depression     Esophageal reflux     Hiatal hernia     History of colon cancer 2015    History of colonic polyps     Pre-Cancerous    History of radiofrequency ablation (RFA) of nerve of cervical spine     HLD (hyperlipidemia)     Hypercalcemia     Hyperparathyroidism (CMS HCC)     Hypertension     Low magnesium  level     Post-menopausal     Pulmonary nodules     Renal cyst     RLS (restless legs syndrome)     Sleep apnea     CPAP - [employer]    SVT (supraventricular tachycardia) (CMS HCC)     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)     Unspecified urinary incontinence     Vitamin D deficiency        Past Surgical History:   Procedure Laterality Date    COLONOSCOPY      ESOPHAGOGASTRODUODENOSCOPY      HX CARDIAC ABLATION  2013    HX CARPAL TUNNEL RELEASE Left 2010    HX COLON SURGERY (ANY)      Colon resection- due to colon cancer  HX TAH AND BSO  1998    ORTHOPEDIC SURGERY      right ankle    PARATHYROID  GLAND SURGERY      RADIOFREQUENCY ABLATION      Cervical spine       Social History     Socioeconomic History    Marital status: Widowed   Tobacco Use    Smoking status: Former     Current packs/day: 0.00     Average packs/day: 1 pack/day for 16.0 years (16.0 ttl pk-yrs)     Types: Cigarettes     Start date: 91     Quit date: 63     Years since quitting: 43.0     Passive exposure: Past    Smokeless tobacco: Never   Vaping Use    Vaping status: Never Used   Substance and Sexual Activity    Alcohol use: Never    Drug use: Never       Allergies  Allergies   Allergen Reactions    Amoxicillin Rash    Clavulanic Acid Rash    Penicillins Rash    Potassium Clavulanate Itching         PHYSICAL EXAM  VITAL SIGNS:  Filed Vitals:    04/19/24 0432   BP: (!) 147/77   Pulse: 68   Resp: 18   Temp: 36.2 C (97.2 F)   SpO2: 96%     Constitutional: Awake. Average body weight. Generally healthy  appearing. No distress noted.   Cardiovascular: Regular rate. No murmur or gallop heard.   Pulmonary/Chest: Breath sounds clear and equal bilaterally. No chest tenderness. No respiratory distress.   Abdominal: Bowel sound normal. Abdomen soft, epigastric tenderness. No CVA tenderness             Musculoskeletal: No tenderness or deformity. Normal muscle tone and strength.   Skin: warm and dry. No rash, redness, or bruising  Psychiatric: normal mood and affect. Behavior is normal.   Neurological: Alert, oriented. Normal gait. No focal weakness noted. No sensory deficit. No speech disturbances         Diagnostics    Labs  Results for orders placed or performed during the hospital encounter of 04/19/24   ECG 12 LEAD   Result Value Ref Range    Ventricular rate 62 BPM    Atrial Rate 61 BPM    PR Interval 132 ms    QRS Duration 106 ms    QT Interval 398 ms    QTC Calculation 403 ms    Calculated P Axis 56 degrees    Calculated R Axis 54 degrees    Calculated T Axis -45 degrees       Radiology       Results        Medical Decision Making     Medical Decision Making  A 73 year old female presented with constant, dull epigastric pain that worsened today, associated with intermittent nausea but no vomiting, diarrhea, constipation, fever, chills, urinary symptoms, chest pain, or shortness of breath. She has a remote history of SVT ablation and recently discontinued Protonix  and Prilosec, with unclear prior diagnosis of GERD. Examination revealed mild epigastric tenderness without peritoneal signs, normal heart and lung sounds, and no lower extremity edema. A portion of the history was provided by her daughters, serving as independent historians.    Differential diagnosis includes, but is not limited to:  - Gastroesophageal Reflux Disease (GERD) or Acid-Related Dyspepsia: Recent discontinuation of acid-suppressing medications and the character of the  pain suggest GERD or acid-related dyspepsia as a likely etiology.  - Acute  Coronary Syndrome: Epigastric pain in older women can be an atypical presentation of cardiac ischemia, warranting cardiac evaluation despite absence of chest pain or shortness of breath.  - Acute Pancreatitis: Pancreatitis is considered due to the location of pain, though there is no history of pancreatitis and no associated gastrointestinal symptoms such as vomiting or diarrhea.    Epigastric pain  - Order abdominal scan and laboratory studies to evaluate for pancreatitis and other abdominal pathology.  - Perform cardiac evaluation to assess for cardiac causes of pain.  - Administer medication for epigastric discomfort.    Medical Decision Making  Amount and/or Complexity of Data Reviewed  Labs: ordered.  Radiology: ordered.  ECG/medicine tests: ordered.    Risk  OTC drugs.  Prescription drug management.        ED Course  ED Course as of 04/19/24 0614   Tue Apr 19, 2024   0504 WBC: 7.9  Normal. PMNs 64   0531 B-TYPE NATRIURETIC PEPTIDE(!): 102  Slightly elevated   0532 LIPASE: 17  Normal   0532 TROPONIN-I: 3  Initial, normal   0532 CREATININE: 0.77  GFR 81 BUN 16   0532 BILIRUBIN, TOTAL: 0.6  ALT 11 AST 13 Alk Phos 109   0533 PROTHROMBIN TIME: 12.4  INR 1.10 PTT 31.4   0611 TROPONIN-I: 3  Repeat, normal   0613 Care endorsed to Sueanne Benders, NP at 0600 am       Medications Ordered/Administered in the ED   NS flush syringe (0 mL Intracatheter Not Given 04/19/24 0600)   NS flush syringe (has no administration in time range)   NS 250 mL flush bag (has no administration in time range)     And   D5W 250 mL flush bag (has no administration in time range)   NS bolus infusion 1,000 mL (1,000 mL Intravenous New Bag/New Syringe 04/19/24 0532)   aluminum -magnesium  hydroxide-simethicone  (MAG-AL PLUS) 200-200-20 mg per 5 mL oral liquid (30 mL Oral Given 04/19/24 0507)     And   hyoscyamine  (HYOSYNE ) 0.125 mg per 5 mL oral liquid (10 mL Oral Given 04/19/24 0507)     And   lidocaine  (XYLOCAINE ) 2% oral topical viscous solution  (15 mL Oral Given 04/19/24 0507)   famotidine  (PEPCID ) 10 mg/mL injection (20 mg Intravenous Given 04/19/24 0531)   iohexol  (OMNIPAQUE  350) solution (75 mL Intravenous Given 04/19/24 0607)       Clinical Impression   Epigastric abdominal pain (Primary)             Belmira Daley K. Kawon Willcutt- APRN, FNP-C, [facility]  [facility]  Department of Emergency Medicine

## 2024-04-19 NOTE — ED Nurses Note (Signed)
 Patient discharged home with family.  Patient alert and oriented x4. AVS reviewed with patient/care giver.  A written copy of the AVS and discharge instructions was given to the patient/care giver.  Questions sufficiently answered as needed.  Patient/care giver encouraged to follow up with PCP as indicated.  In the event of an emergency, patient/care giver instructed to call 911 or go to the nearest emergency room. Patient's iv removed at this time, bleeding controlled and pressure dressing applied. Patient leaves Emergency Department at this time ambulatory with a steady gait.

## 2024-04-24 ENCOUNTER — Encounter (INDEPENDENT_AMBULATORY_CARE_PROVIDER_SITE_OTHER): Payer: Self-pay | Admitting: PHYSICIAN ASSISTANT

## 2024-04-29 ENCOUNTER — Other Ambulatory Visit (INDEPENDENT_AMBULATORY_CARE_PROVIDER_SITE_OTHER): Payer: Self-pay | Admitting: PHYSICIAN ASSISTANT

## 2024-05-02 MED ORDER — MELOXICAM 7.5 MG TABLET: 7.5000 mg | ORAL_TABLET | Freq: Two times a day (BID) | ORAL | 0 refills | Status: AC | PRN

## 2024-05-04 ENCOUNTER — Telehealth (INDEPENDENT_AMBULATORY_CARE_PROVIDER_SITE_OTHER): Payer: Self-pay | Admitting: PHYSICIAN ASSISTANT

## 2024-05-04 NOTE — Telephone Encounter (Signed)
 Pt got it

## 2024-05-14 ENCOUNTER — Other Ambulatory Visit (INDEPENDENT_AMBULATORY_CARE_PROVIDER_SITE_OTHER): Payer: Self-pay | Admitting: PHYSICIAN ASSISTANT

## 2024-05-14 DIAGNOSIS — E785 Hyperlipidemia, unspecified: Secondary | ICD-10-CM

## 2024-05-23 ENCOUNTER — Ambulatory Visit (INDEPENDENT_AMBULATORY_CARE_PROVIDER_SITE_OTHER): Payer: Self-pay | Admitting: PHYSICIAN ASSISTANT

## 2024-06-22 ENCOUNTER — Ambulatory Visit (INDEPENDENT_AMBULATORY_CARE_PROVIDER_SITE_OTHER): Payer: Self-pay | Admitting: PHYSICIAN ASSISTANT

## 2024-07-19 ENCOUNTER — Ambulatory Visit (HOSPITAL_COMMUNITY): Payer: Self-pay | Admitting: SLEEP MEDICINE

## 2024-08-22 ENCOUNTER — Ambulatory Visit (INDEPENDENT_AMBULATORY_CARE_PROVIDER_SITE_OTHER): Payer: Self-pay | Admitting: PHYSICIAN ASSISTANT

## 2024-10-13 ENCOUNTER — Ambulatory Visit (HOSPITAL_COMMUNITY): Payer: Self-pay | Admitting: SLEEP MEDICINE
